# Patient Record
Sex: Male | Born: 1935 | Race: White | Hispanic: No | Marital: Married | Smoking: Never smoker
Health system: Southern US, Community
[De-identification: ages and names within clinical notes are randomized; demographics above are authoritative.]

## PROBLEM LIST (undated history)

## (undated) DIAGNOSIS — R001 Bradycardia, unspecified: Secondary | ICD-10-CM

## (undated) DIAGNOSIS — R002 Palpitations: Secondary | ICD-10-CM

## (undated) DIAGNOSIS — G5600 Carpal tunnel syndrome, unspecified upper limb: Secondary | ICD-10-CM

## (undated) DIAGNOSIS — E785 Hyperlipidemia, unspecified: Secondary | ICD-10-CM

## (undated) HISTORY — DX: Hyperlipidemia, unspecified: E78.5

## (undated) HISTORY — DX: Carpal tunnel syndrome, unspecified upper limb: G56.00

## (undated) HISTORY — DX: Palpitations: R00.2

## (undated) HISTORY — DX: Bradycardia, unspecified: R00.1

## (undated) HISTORY — PX: TRANSTHORACIC ECHOCARDIOGRAM: SHX275

---

## 2008-07-06 ENCOUNTER — Emergency Department (HOSPITAL_BASED_OUTPATIENT_CLINIC_OR_DEPARTMENT_OTHER): Admission: EM | Admit: 2008-07-06 | Discharge: 2008-07-06 | Payer: Self-pay | Admitting: Emergency Medicine

## 2008-07-18 HISTORY — PX: TRANSTHORACIC ECHOCARDIOGRAM: SHX275

## 2010-05-20 LAB — CBC
Platelets: 169 10*3/uL (ref 150–400)
RBC: 4.71 MIL/uL (ref 4.22–5.81)
WBC: 5.3 10*3/uL (ref 4.0–10.5)

## 2010-05-20 LAB — POCT CARDIAC MARKERS
CKMB, poc: 1.6 ng/mL (ref 1.0–8.0)
Myoglobin, poc: 48.3 ng/mL (ref 12–200)
Myoglobin, poc: 64.7 ng/mL (ref 12–200)

## 2010-05-20 LAB — DIFFERENTIAL
Basophils Relative: 0 % (ref 0–1)
Lymphs Abs: 1.1 10*3/uL (ref 0.7–4.0)
Monocytes Relative: 10 % (ref 3–12)
Neutro Abs: 3.6 10*3/uL (ref 1.7–7.7)
Neutrophils Relative %: 67 % (ref 43–77)

## 2010-05-20 LAB — BASIC METABOLIC PANEL
Calcium: 9.4 mg/dL (ref 8.4–10.5)
Creatinine, Ser: 1.2 mg/dL (ref 0.4–1.5)
GFR calc Af Amer: >60 mL/min (ref >60)

## 2011-05-07 ENCOUNTER — Encounter: Payer: Self-pay | Admitting: *Deleted

## 2011-11-16 ENCOUNTER — Encounter: Payer: Self-pay | Admitting: *Deleted

## 2013-04-14 ENCOUNTER — Encounter: Payer: Self-pay | Admitting: Interventional Cardiology

## 2013-05-19 ENCOUNTER — Ambulatory Visit: Payer: Self-pay | Admitting: Interventional Cardiology

## 2013-06-15 ENCOUNTER — Ambulatory Visit (INDEPENDENT_AMBULATORY_CARE_PROVIDER_SITE_OTHER): Payer: Medicare Other | Admitting: Interventional Cardiology

## 2013-06-15 ENCOUNTER — Encounter: Payer: Self-pay | Admitting: Interventional Cardiology

## 2013-06-15 VITALS — BP 120/68 | HR 61 | Ht 66.0 in | Wt 194.0 lb

## 2013-06-15 DIAGNOSIS — I4949 Other premature depolarization: Secondary | ICD-10-CM

## 2013-06-15 DIAGNOSIS — I493 Ventricular premature depolarization: Secondary | ICD-10-CM

## 2013-06-15 DIAGNOSIS — R030 Elevated blood-pressure reading, without diagnosis of hypertension: Secondary | ICD-10-CM

## 2013-06-15 NOTE — Patient Instructions (Signed)
Your physician recommends that you continue on your current medications as directed. Please refer to the Current Medication list given to you today.  Your physician wants you to follow-up in: 1 year with Dr. Varanasi. You will receive a reminder letter in the mail two months in advance. If you don't receive a letter, please call our office to schedule the follow-up appointment.  

## 2013-06-15 NOTE — Progress Notes (Signed)
Patient ID: Brandon Gomez, male   DOB: 1934-01-30, 80 y.o.   MRN: [id number]    [address], [place], [place]  [address] Phone: [phone] Fax:  [phone]  Date:  06/15/2013   ID:  Brandon Gomez, DOB [birth year], MRN [id number]  PCP:  Donnajean Lopes, MD      History of Present Illness: Brandon Gomez is a 80 y.o. male who has had HTN and PACs. He has been checking his BP. Systolic BP is in the 762-263 range. Diastolic has been in the 33L for the most part but goes into the 80s sometimes. HE has not felt PACs, but has noted them when he checks his pulse. He has occasionally taken his Metoprolol. Hyperlipidemia:  Denies : Chest pain.  Shortness of breath.  Dizziness.  Leg edema.  He moves a lot with his job at [employer]. he does not walk regularly.    Wt Readings from Last 3 Encounters:  06/15/13 194 lb (87.998 kg)     Past Medical History  Diagnosis Date  . Hyperlipidemia   . Bradycardia   . Palpitations     Current Outpatient Prescriptions  Medication Sig Dispense Refill  . aspirin 81 MG tablet Take 81 mg by mouth daily.      . Cholecalciferol (VITAMIN D) 2000 UNITS CAPS Take 1 capsule by mouth.      . lovastatin (MEVACOR) 20 MG tablet Take 20 mg by mouth at bedtime.      . Multiple Vitamin (MULTIVITAMIN) tablet Take 1 tablet by mouth daily.      . Omega-3 Fatty Acids (OMEGA 3 PO) Take by mouth. 2 PACKETS DAILY      . vitamin B-12 (CYANOCOBALAMIN) 250 MCG tablet Take 250 mcg by mouth daily.       No current facility-administered medications for this visit.    Allergies:   No Known Allergies  Social History:  The patient  reports that he has never smoked. He does not have any smokeless tobacco history on file.   Family History:  The patient's family history is not on file.   ROS:  Please see the history of present illness.  No nausea, vomiting.  No fevers, chills.  No focal weakness.  No dysuria.   All other systems reviewed and negative.    PHYSICAL EXAM: VS:  BP 120/68  Pulse 61  Ht 5\' 6"  (1.676 m)  Wt 194 lb (87.998 kg)  BMI 31.33 kg/m2 Well nourished, well developed, in no acute distress HEENT: normal Neck: no JVD, no carotid bruits Cardiac:  normal S1, S2; RRR;  Lungs:  clear to auscultation bilaterally, no wheezing, rhonchi or rales Abd: soft, nontender, no hepatomegaly Ext: no edema Skin: warm and dry Neuro:   no focal abnormalities noted  EKG:  Normal     ASSESSMENT AND PLAN:  Observation for suspected cardiovascular disease  Diagnostic Imaging:EKG Corson,Danielle 01/22/2012 09:49:30 AM > VARANASI,JAY 01/22/2012 10:12:28 AM > NSR, PVC  Occasional premature beats. No symptoms. PACs , PVCs in the past.     2. Elevated blood pressure reading without diagnosis of hypertension  Most readings at home are well controlled. WOuld not add medicine at this time.    Preventive Medicine  Adult topics discussed:  Exercise: 5 days a week, at least 30 minutes of aerobic exercise.      Signed, Mina Marble, MD, [facility] 06/15/2013 11:04 AM

## 2013-06-22 ENCOUNTER — Ambulatory Visit: Payer: Self-pay | Admitting: Interventional Cardiology

## 2014-06-27 ENCOUNTER — Encounter: Payer: Self-pay | Admitting: Interventional Cardiology

## 2014-06-27 ENCOUNTER — Ambulatory Visit (INDEPENDENT_AMBULATORY_CARE_PROVIDER_SITE_OTHER): Payer: Medicare Other | Admitting: Interventional Cardiology

## 2014-06-27 ENCOUNTER — Other Ambulatory Visit: Payer: Self-pay | Admitting: Interventional Cardiology

## 2014-06-27 VITALS — BP 130/88 | HR 61 | Ht 67.0 in | Wt 195.8 lb

## 2014-06-27 DIAGNOSIS — R030 Elevated blood-pressure reading, without diagnosis of hypertension: Secondary | ICD-10-CM

## 2014-06-27 DIAGNOSIS — I493 Ventricular premature depolarization: Secondary | ICD-10-CM | POA: Diagnosis not present

## 2014-06-27 NOTE — Patient Instructions (Signed)
Medication Instructions:  Your physician recommends that you continue on your current medications as directed. Please refer to the Current Medication list given to you today.  Labwork: We will get a copy of your recent lab work from [facility].  Testing/Procedures: NONE  Follow-Up: Your physician wants you to follow-up in: 12 months with Dr. Irish Lack. You will receive a reminder letter in the mail two months in advance. If you don't receive a letter, please call our office to schedule the follow-up appointment.   Any Other Special Instructions Will Be Listed Below (If Applicable).

## 2014-06-27 NOTE — Progress Notes (Signed)
Patient ID: Brandon Gomez, male   DOB: 1933-11-27, 81 y.o.   MRN: [id number]     Cardiology Office Note   Date:  06/27/2014   ID:  Brandon Gomez, DOB [birth year], MRN [id number]  PCP:  Donnajean Lopes, MD    No chief complaint on file.    Wt Readings from Last 3 Encounters:  06/27/14 195 lb 12.8 oz (88.814 kg)  06/15/13 194 lb (87.998 kg)       History of Present Illness: Brandon Gomez is a 81 y.o. male  who has had HTN and PACs. He has been checking his BP. Systolic BP is in the 532-992 range. Diastolic has been in the 42A for the most part but goes into the 80s sometimes. He has not felt PACs, but has noted them when he checks his pulse. He has not taken his Metoprolol in the past. Hyperlipidemia:  Denies : Chest pain.  Shortness of breath.  Dizziness.  Leg edema.  He moves a lot with his job at [employer]. he does not walk regularly.  He forgot that he had metoprolol, but he does not need to use it.  He thinks it has expired.       Past Medical History  Diagnosis Date  . Hyperlipidemia   . Bradycardia   . Palpitations     Past Surgical History  Procedure Laterality Date  . Transthoracic echocardiogram  07/18/2008    EF-55-60%/no valvular abnormality/mitral inflow and tissue doppler consistent with impaired LV relaxation     Current Outpatient Prescriptions  Medication Sig Dispense Refill  . aspirin 81 MG tablet Take 81 mg by mouth daily.    . Cholecalciferol (VITAMIN D) 2000 UNITS CAPS Take 1 capsule by mouth.    . lovastatin (MEVACOR) 40 MG tablet Take 20 mg by mouth at bedtime.  0  . Multiple Vitamin (MULTIVITAMIN) tablet Take 1 tablet by mouth daily.    . Omega-3 Fatty Acids (OMEGA 3 PO) Take 2 packets by mouth daily.     . vitamin B-12 (CYANOCOBALAMIN) 250 MCG tablet Take 250 mcg by mouth daily.     No current facility-administered medications for this visit.    Allergies:   Review of patient's allergies indicates no known allergies.    Social  History:  The patient  reports that he has never smoked. He has never used smokeless tobacco.   Family History:  The patient's *family history includes Diabetes in his father and mother; Heart attack in his brother, father, and mother; Heart disease in his sister; Hypertension in his father.    ROS:  Please see the history of present illness.   .   All other systems are reviewed and negative.    PHYSICAL EXAM: VS:  BP 130/88 mmHg  Pulse 61  Ht 5\' 7"  (1.702 m)  Wt 195 lb 12.8 oz (88.814 kg)  BMI 30.66 kg/m2 , BMI Body mass index is 30.66 kg/(m^2). GEN: Well nourished, well developed, in no acute distress HEENT: normal Neck: no JVD, carotid bruits, or masses Cardiac: *RRR; no murmurs, rubs, or gallops,no edema  Respiratory:  clear to auscultation bilaterally, normal work of breathing GI: soft, nontender, nondistended, + BS MS: no deformity or atrophy Skin: warm and dry, no rash Neuro:  Strength and sensation are intact Psych: euthymic mood, full affect   EKG:   The ekg ordered today demonstrates normal   Recent Labs: No results found for requested labs within last 365 days.   Lipid Panel No results found for:  CHOL, TRIG, HDL, CHOLHDL, VLDL, LDLCALC, LDLDIRECT   Other studies Reviewed: Additional studies/ records that were reviewed today with results demonstrating: .   ASSESSMENT AND PLAN:  PACs, PVC:  Occasional premature beats. No symptoms. PACs , PVCs in the past. He will let us know if he wants a new Rx for the metoprolol.    2. Elevated blood pressure reading without diagnosis of hypertension  Most readings at home are well controlled. WOuld not add medicine at this time. COntinue lifestyle modifications including exercise as detailed below.            Current medicines are reviewed at length with the patient today.  The patient concerns regarding his medicines were addressed.  The following changes have been made:  No change  Labs/ tests ordered today  include:  No orders of the defined types were placed in this encounter.    Recommend 150 minutes/week of aerobic exercise Low fat, low carb, high fiber diet recommended  Disposition:   FU in 1 year   Teresita Madura., MD  06/27/2014 10:27 AM    [facility] [place], [place], [place]  [address] Phone: [phone]; Fax: [phone]

## 2015-10-03 ENCOUNTER — Other Ambulatory Visit: Payer: Self-pay | Admitting: Internal Medicine

## 2015-12-12 ENCOUNTER — Encounter: Payer: Self-pay | Admitting: Interventional Cardiology

## 2015-12-26 NOTE — Progress Notes (Signed)
Patient ID: Brandon Gomez, male   DOB: 1933/10/21, 82 y.o.   MRN: [id number]     Cardiology Office Note   Date:  12/27/2015   ID:  Brandon Gomez, DOB [birth year], MRN [id number]  PCP:  Donnajean Lopes, MD    No chief complaint on file.    Wt Readings from Last 3 Encounters:  12/27/15 86.9 kg (191 lb 9.6 oz)  06/27/14 88.8 kg (195 lb 12.8 oz)  06/15/13 88 kg (194 lb)       History of Present Illness: Brandon Gomez is a 82 y.o. male  who has had HTN and PACs. He has been checking his BP. Systolic BP is in the 0000000 range. Diastolic has been in the Q000111Q for the most part but goes into the 80s sometimes. He has not felt PACs, but has noted them when he checks his pulse. He has not taken his Metoprolol in the past. Hyperlipidemia:  Denies : Chest pain.  Shortness of breath.  Dizziness.  Leg edema.  He moves a lot with his job at [employer]. he does not walk regularly.  He forgot that he had metoprolol, but he does not need to use it.  He thinks it has expired.   He does not feel his PACs.  He states the BP cuff occasionally informs him.     Past Medical History:  Diagnosis Date  . Bradycardia   . Hyperlipidemia   . Palpitations     Past Surgical History:  Procedure Laterality Date  . TRANSTHORACIC ECHOCARDIOGRAM  07/18/2008   EF-55-60%/no valvular abnormality/mitral inflow and tissue doppler consistent with impaired LV relaxation     Current Outpatient Prescriptions  Medication Sig Dispense Refill  . aspirin 81 MG tablet Take 81 mg by mouth daily.    . Cholecalciferol (VITAMIN D) 2000 UNITS CAPS Take 1 capsule by mouth.    . lovastatin (MEVACOR) 40 MG tablet Take 20 mg by mouth at bedtime.  0  . Multiple Vitamin (MULTIVITAMIN) tablet Take 1 tablet by mouth daily.    Marland Kitchen neomycin-polymyxin-hydrocortisone (CORTISPORIN) 3.5-10000-1 otic suspension Place 1 drop into both ears daily.   1  . Omega-3 Fatty Acids (OMEGA 3 PO) Take 2 packets by mouth daily.     . vitamin  B-12 (CYANOCOBALAMIN) 250 MCG tablet Take 250 mcg by mouth daily.     No current facility-administered medications for this visit.     Allergies:   Patient has no known allergies.    Social History:  The patient  reports that he has never smoked. He has never used smokeless tobacco. He reports that he does not drink alcohol or use drugs.   Family History:  The patient's *family history includes Diabetes in his father and mother; Heart attack in his brother, father, and mother; Heart disease in his sister; Hypertension in his father.    ROS:  Please see the history of present illness.  Rare PACs noted by BP cuff.   All other systems are reviewed and negative.    PHYSICAL EXAM: VS:  BP 130/80   Pulse (!) 59   Ht 5\' 7"  (1.702 m)   Wt 86.9 kg (191 lb 9.6 oz)   BMI 30.01 kg/m  , BMI Body mass index is 30.01 kg/m. GEN: Well nourished, well developed, in no acute distress  HEENT: normal  Neck: no JVD, carotid bruits, or masses Cardiac: *RRR; no murmurs, rubs, or gallops,no edema  Respiratory:  clear to auscultation bilaterally, normal work of breathing GI: soft, nontender,  nondistended, + BS MS: no deformity or atrophy  Skin: warm and dry, no rash Neuro:  Strength and sensation are intact Psych: euthymic mood, full affect   EKG:   The ekg ordered today demonstrates normal   Recent Labs: No results found for requested labs within last 8760 hours.   Lipid Panel No results found for: CHOL, TRIG, HDL, CHOLHDL, VLDL, LDLCALC, LDLDIRECT   Other studies Reviewed: Additional studies/ records that were reviewed today with results demonstrating: .   ASSESSMENT AND PLAN:  PACs, PVC:  Occasional premature beats. No symptoms. PACs , PVCs in the past. Picked up by the blood pressure monitor.Will call in a new Rx for the metoprolol 12.5 mg by mouth twice a day when necessary.    2. Elevated blood pressure reading without diagnosis of hypertension  In the past. Blood pressure  controlled today.  Most readings at home are well controlled. WOuld not add medicine at this time. COntinue lifestyle modifications including exercise as detailed below.            Current medicines are reviewed at length with the patient today.  The patient concerns regarding his medicines were addressed.  The following changes have been made:  No change  Labs/ tests ordered today include:  No orders of the defined types were placed in this encounter.   Recommend 150 minutes/week of aerobic exercise Low fat, low carb, high fiber diet recommended  Disposition:   FU in 1 year   Signed, Larae Grooms, MD  12/27/2015 10:02 AM    [facility] [place], [place], [place]  [address] Phone: [phone]; Fax: [phone]

## 2015-12-27 ENCOUNTER — Encounter (INDEPENDENT_AMBULATORY_CARE_PROVIDER_SITE_OTHER): Payer: Self-pay

## 2015-12-27 ENCOUNTER — Ambulatory Visit (INDEPENDENT_AMBULATORY_CARE_PROVIDER_SITE_OTHER): Payer: Medicare Other | Admitting: Interventional Cardiology

## 2015-12-27 ENCOUNTER — Encounter: Payer: Self-pay | Admitting: Interventional Cardiology

## 2015-12-27 VITALS — BP 130/80 | HR 59 | Ht 67.0 in | Wt 191.6 lb

## 2015-12-27 DIAGNOSIS — I493 Ventricular premature depolarization: Secondary | ICD-10-CM | POA: Diagnosis not present

## 2015-12-27 DIAGNOSIS — I491 Atrial premature depolarization: Secondary | ICD-10-CM | POA: Diagnosis not present

## 2015-12-27 MED ORDER — METOPROLOL TARTRATE 25 MG PO TABS: 12.5000 mg | ORAL_TABLET | Freq: Two times a day (BID) | ORAL | 3 refills | Status: DC | PRN

## 2015-12-27 NOTE — Patient Instructions (Signed)
Medication Instructions:  Start taking Metoprolol 12.5 mg twice daily as needed for palpitations. All other medications remain the same.  Labwork: None  Testing/Procedures: None  Follow-Up: Your physician wants you to follow-up in: 1 year. You will receive a reminder letter in the mail two months in advance. If you don't receive a letter, please call our office to schedule the follow-up appointment.     If you need a refill on your cardiac medications before your next appointment, please call your pharmacy.

## 2015-12-30 ENCOUNTER — Other Ambulatory Visit: Payer: Self-pay | Admitting: Internal Medicine

## 2015-12-30 DIAGNOSIS — R911 Solitary pulmonary nodule: Secondary | ICD-10-CM

## 2016-01-08 ENCOUNTER — Ambulatory Visit
Admission: RE | Admit: 2016-01-08 | Discharge: 2016-01-08 | Disposition: A | Discharge status: Home / Self Care | Payer: Medicare Other | Source: Ambulatory Visit | Attending: Internal Medicine | Admitting: Internal Medicine

## 2016-01-08 DIAGNOSIS — R911 Solitary pulmonary nodule: Secondary | ICD-10-CM

## 2017-01-08 ENCOUNTER — Other Ambulatory Visit: Payer: Self-pay | Admitting: Internal Medicine

## 2017-01-08 DIAGNOSIS — R911 Solitary pulmonary nodule: Secondary | ICD-10-CM

## 2017-01-14 ENCOUNTER — Ambulatory Visit
Admission: RE | Admit: 2017-01-14 | Discharge: 2017-01-14 | Disposition: A | Discharge status: Home / Self Care | Payer: Medicare Other | Source: Ambulatory Visit | Attending: Internal Medicine | Admitting: Internal Medicine

## 2017-01-14 DIAGNOSIS — R911 Solitary pulmonary nodule: Secondary | ICD-10-CM

## 2017-03-11 DIAGNOSIS — R69 Illness, unspecified: Secondary | ICD-10-CM | POA: Diagnosis not present

## 2017-03-17 ENCOUNTER — Encounter: Payer: Self-pay | Admitting: Interventional Cardiology

## 2017-03-22 NOTE — Progress Notes (Signed)
Cardiology Office Note   Date:  03/24/2017   ID:  Brandon Gomez, DOB May 26, 1933, MRN [id number]  PCP:  Leanna Battles, MD    No chief complaint on file.  PACs  Wt Readings from Last 3 Encounters:  03/24/17 193 lb 1.9 oz (87.6 kg)  12/27/15 191 lb 9.6 oz (86.9 kg)  06/27/14 195 lb 12.8 oz (88.8 kg)       History of Present Illness: Brandon Gomez is a 84 y.o. male   who has had HTN and PACs. He has been checking his BP. Systolic BP is in the 010-932 range. Diastolic has been in the 35T for the most part but goes into the 80s sometimes. He has not felt PACs, but has noted them when he checks his pulse. He has not taken his Metoprolol in the past.  Works at [employer].    He does not feel his PACs.  He states the BP cuff occasionally informs him.   Denies : Chest pain. Dizziness. Leg edema. Nitroglycerin use. Orthopnea. Palpitations. Paroxysmal nocturnal dyspnea. Shortness of breath. Syncope.   He is taking prophylactic dose of Tamiflu due to his wife's illness.       Past Medical History:  Diagnosis Date  . Bradycardia   . Hyperlipidemia   . Palpitations     Past Surgical History:  Procedure Laterality Date  . TRANSTHORACIC ECHOCARDIOGRAM  07/18/2008   EF-55-60%/no valvular abnormality/mitral inflow and tissue doppler consistent with impaired LV relaxation     Current Outpatient Medications  Medication Sig Dispense Refill  . aspirin 81 MG tablet Take 81 mg by mouth daily.    . Cholecalciferol (VITAMIN D) 2000 UNITS CAPS Take 1 capsule by mouth.    . lovastatin (MEVACOR) 40 MG tablet Take 20 mg by mouth at bedtime.  0  . Multiple Vitamin (MULTIVITAMIN) tablet Take 1 tablet by mouth daily.    Marland Kitchen neomycin-polymyxin-hydrocortisone (CORTISPORIN) 3.5-10000-1 otic suspension Place 1 drop into both ears daily.   1  . Omega-3 Fatty Acids (OMEGA 3 PO) Take 2 packets by mouth daily.     Marland Kitchen oseltamivir (TAMIFLU) 75 MG capsule Take 75 mg by mouth daily.    . vitamin  B-12 (CYANOCOBALAMIN) 250 MCG tablet Take 250 mcg by mouth daily.    . metoprolol tartrate (LOPRESSOR) 25 MG tablet Take 0.5 tablets (12.5 mg total) by mouth 2 (two) times daily as needed. 60 tablet 3   No current facility-administered medications for this visit.     Allergies:   Patient has no known allergies.    Social History:  The patient  reports that  has never smoked. he has never used smokeless tobacco. He reports that he does not drink alcohol or use drugs.   Family History:  The patient's family history includes Diabetes in his father and mother; Heart attack in his brother, father, and mother; Heart disease in his sister; Hypertension in his father.    ROS:  Please see the history of present illness.   Otherwise, review of systems are positive for minimal palpitations.   All other systems are reviewed and negative.    PHYSICAL EXAM: VS:  BP 122/88   Pulse 62   Ht 5\' 8"  (1.727 m)   Wt 193 lb 1.9 oz (87.6 kg)   SpO2 93%   BMI 29.36 kg/m  , BMI Body mass index is 29.36 kg/m. GEN: Well nourished, well developed, in no acute distress  HEENT: normal  Neck: no JVD, carotid bruits, or  masses Cardiac: RRR; no murmurs, rubs, or gallops,no edema  Respiratory:  clear to auscultation bilaterally, normal work of breathing GI: soft, nontender, nondistended, + BS MS: no deformity or atrophy  Skin: warm and dry, no rash Neuro:  Strength and sensation are intact Psych: euthymic mood, full affect   EKG:   The ekg ordered today demonstrates NSR, no ST changes   Recent Labs: No results found for requested labs within last 8760 hours.   Lipid Panel No results found for: CHOL, TRIG, HDL, CHOLHDL, VLDL, LDLCALC, LDLDIRECT   Other studies Reviewed: Additional studies/ records that were reviewed today with results demonstrating: LDL 86 in May 2018.   ASSESSMENT AND PLAN:  1. PACs, PVCs : Essentially asymptomatic.  Continue current medicines. 2. Elevated BP : Blood pressure  well controlled today. 3. Continue healthy diet with regular exercise.   Current medicines are reviewed at length with the patient today.  The patient concerns regarding his medicines were addressed.  The following changes have been made:  No change  Labs/ tests ordered today include:  No orders of the defined types were placed in this encounter.   Recommend 150 minutes/week of aerobic exercise Low fat, low carb, high fiber diet recommended  Disposition:   FU in 1 year   Signed, Larae Grooms, MD  03/24/2017 10:33 AM    [facility] [place], [place], [place]  [address] Phone: [phone]; Fax: [phone]

## 2017-03-24 ENCOUNTER — Ambulatory Visit: Payer: Medicare Other | Admitting: Interventional Cardiology

## 2017-03-24 ENCOUNTER — Encounter: Payer: Self-pay | Admitting: Interventional Cardiology

## 2017-03-24 VITALS — BP 122/88 | HR 62 | Ht 68.0 in | Wt 193.1 lb

## 2017-03-24 DIAGNOSIS — I493 Ventricular premature depolarization: Secondary | ICD-10-CM

## 2017-03-24 DIAGNOSIS — I491 Atrial premature depolarization: Secondary | ICD-10-CM

## 2017-03-24 DIAGNOSIS — R030 Elevated blood-pressure reading, without diagnosis of hypertension: Secondary | ICD-10-CM | POA: Diagnosis not present

## 2017-03-24 NOTE — Patient Instructions (Signed)

## 2017-06-02 DIAGNOSIS — R69 Illness, unspecified: Secondary | ICD-10-CM | POA: Diagnosis not present

## 2017-07-08 DIAGNOSIS — E7849 Other hyperlipidemia: Secondary | ICD-10-CM | POA: Diagnosis not present

## 2017-07-08 DIAGNOSIS — Z125 Encounter for screening for malignant neoplasm of prostate: Secondary | ICD-10-CM | POA: Diagnosis not present

## 2017-07-09 DIAGNOSIS — Z820 Family history of epilepsy and other diseases of the nervous system: Secondary | ICD-10-CM | POA: Diagnosis not present

## 2017-07-09 DIAGNOSIS — Z823 Family history of stroke: Secondary | ICD-10-CM | POA: Diagnosis not present

## 2017-07-09 DIAGNOSIS — E669 Obesity, unspecified: Secondary | ICD-10-CM | POA: Diagnosis not present

## 2017-07-09 DIAGNOSIS — Z6830 Body mass index (BMI) 30.0-30.9, adult: Secondary | ICD-10-CM | POA: Diagnosis not present

## 2017-07-09 DIAGNOSIS — K219 Gastro-esophageal reflux disease without esophagitis: Secondary | ICD-10-CM | POA: Diagnosis not present

## 2017-07-09 DIAGNOSIS — Z8249 Family history of ischemic heart disease and other diseases of the circulatory system: Secondary | ICD-10-CM | POA: Diagnosis not present

## 2017-07-09 DIAGNOSIS — Z833 Family history of diabetes mellitus: Secondary | ICD-10-CM | POA: Diagnosis not present

## 2017-07-09 DIAGNOSIS — E785 Hyperlipidemia, unspecified: Secondary | ICD-10-CM | POA: Diagnosis not present

## 2017-07-15 DIAGNOSIS — E7849 Other hyperlipidemia: Secondary | ICD-10-CM | POA: Diagnosis not present

## 2017-07-15 DIAGNOSIS — Z6830 Body mass index (BMI) 30.0-30.9, adult: Secondary | ICD-10-CM | POA: Diagnosis not present

## 2017-07-15 DIAGNOSIS — R911 Solitary pulmonary nodule: Secondary | ICD-10-CM | POA: Diagnosis not present

## 2017-07-15 DIAGNOSIS — N183 Chronic kidney disease, stage 3 (moderate): Secondary | ICD-10-CM | POA: Diagnosis not present

## 2017-07-15 DIAGNOSIS — M19041 Primary osteoarthritis, right hand: Secondary | ICD-10-CM | POA: Diagnosis not present

## 2017-07-15 DIAGNOSIS — Z0000 Encounter for general adult medical examination without abnormal findings: Secondary | ICD-10-CM | POA: Diagnosis not present

## 2017-07-15 DIAGNOSIS — D229 Melanocytic nevi, unspecified: Secondary | ICD-10-CM | POA: Diagnosis not present

## 2017-07-15 DIAGNOSIS — Z1389 Encounter for screening for other disorder: Secondary | ICD-10-CM | POA: Diagnosis not present

## 2017-07-20 ENCOUNTER — Other Ambulatory Visit: Payer: Self-pay | Admitting: Internal Medicine

## 2017-07-20 DIAGNOSIS — R911 Solitary pulmonary nodule: Secondary | ICD-10-CM

## 2017-08-02 ENCOUNTER — Other Ambulatory Visit: Payer: Medicare HMO

## 2017-08-20 DIAGNOSIS — L814 Other melanin hyperpigmentation: Secondary | ICD-10-CM | POA: Diagnosis not present

## 2017-08-20 DIAGNOSIS — L812 Freckles: Secondary | ICD-10-CM | POA: Diagnosis not present

## 2017-08-20 DIAGNOSIS — D485 Neoplasm of uncertain behavior of skin: Secondary | ICD-10-CM | POA: Diagnosis not present

## 2017-08-20 DIAGNOSIS — L821 Other seborrheic keratosis: Secondary | ICD-10-CM | POA: Diagnosis not present

## 2017-08-20 DIAGNOSIS — D224 Melanocytic nevi of scalp and neck: Secondary | ICD-10-CM | POA: Diagnosis not present

## 2017-09-16 ENCOUNTER — Ambulatory Visit (INDEPENDENT_AMBULATORY_CARE_PROVIDER_SITE_OTHER): Payer: Medicare HMO | Admitting: Physician Assistant

## 2017-09-16 ENCOUNTER — Ambulatory Visit (INDEPENDENT_AMBULATORY_CARE_PROVIDER_SITE_OTHER): Payer: Medicare HMO

## 2017-09-16 ENCOUNTER — Encounter (INDEPENDENT_AMBULATORY_CARE_PROVIDER_SITE_OTHER): Payer: Self-pay | Admitting: Physician Assistant

## 2017-09-16 DIAGNOSIS — G8929 Other chronic pain: Secondary | ICD-10-CM

## 2017-09-16 DIAGNOSIS — M545 Low back pain, unspecified: Secondary | ICD-10-CM | POA: Insufficient documentation

## 2017-09-16 MED ORDER — METHOCARBAMOL 500 MG PO TABS
ORAL_TABLET | ORAL | 0 refills | Status: DC
Start: 2017-09-16 — End: 2022-05-18

## 2017-09-16 MED ORDER — METHOCARBAMOL 500 MG PO TABS: ORAL_TABLET | ORAL | 0 refills | Status: DC

## 2017-09-16 MED ORDER — METHYLPREDNISOLONE 4 MG PO TBPK: ORAL_TABLET | ORAL | 0 refills | Status: DC

## 2017-09-16 NOTE — Progress Notes (Signed)
Office Visit Note   Patient: Brandon Gomez Age           Date of Birth: [birth year]           MRN: [id number] Visit Date: 09/16/2017              Requested by: Leanna Battles, MD [address] [place], [place] [address] PCP: Leanna Battles, MD   Assessment & Plan: Visit Diagnoses:  1. Chronic low back pain without sciatica, unspecified back pain laterality     Plan: Impression is left SI joint pain.  We will place the patient on a steroid taper and muscle relaxer.  We will also start him in formal physical therapy.  He will follow-up with [place] in 6 weeks time for recheck.  Follow-Up Instructions: Return in about 6 weeks (around 10/28/2017).   Orders:  Orders Placed This Encounter  Procedures  . XR Lumbar Spine 2-3 Views   Meds ordered this encounter  Medications  . methylPREDNISolone (MEDROL DOSEPAK) 4 MG TBPK tablet    Sig: Take as directed    Dispense:  21 tablet    Refill:  0  . DISCONTD: methocarbamol (ROBAXIN) 500 MG tablet    Sig: Take 1/2-1 tab once daily at night for muscle spasms    Dispense:  10 tablet    Refill:  0  . methocarbamol (ROBAXIN) 500 MG tablet    Sig: Take 1/2-1 tab once daily at night for muscle spasms    Dispense:  10 tablet    Refill:  0      Procedures: No procedures performed   Clinical Data: No additional findings.   Subjective: Chief Complaint  Patient presents with  . Lower Back - Pain    HPI Sigmund is a pleasant 84 year old gentleman who presents to our clinic today with left-sided lower back pain.  This began a few years back pain has progressively worsened.  No known injury or change in activity.  He denies any pain down the back of his leg or to the groin/anterior thigh.  His pain started out intermittent but has become more constant.  The pain he has is described as a sharp pain when he first goes from a seated to standing position.  He also has some pain trying to turn over on the left side.  He was initially taking  ibuprofen which seemed to help, but is no longer relieving his symptoms.  No numbness, tingling or burning.  No bowel or bladder change and no saddle paresthesias.  No lumbar pathology.  Review of Systems as detailed in HPI.  All others reviewed and are negative.   Objective: Vital Signs: There were no vitals taken for this visit.  Physical Exam well-developed and well-nourished gentleman in no acute distress.  Alert and oriented x3.  Ortho Exam examination of his lumbar spine reveals moderate tenderness to the left SI joint.  Negative logroll and negative straight leg raise.  Full range of motion of the lumbar spine although this area feels tight with flexion and extension.  No focal weakness.  He is neurovascularly intact distally.  Specialty Comments:  No specialty comments available.  Imaging: Xr Lumbar Spine 2-3 Views  Result Date: 09/16/2017 X-rays show marked degenerative disc disease at L3-4 with an anterolisthesis at L5    PMFS History: Patient Active Problem List   Diagnosis Date Noted  . Chronic low back pain without sciatica 09/16/2017  . PVC (premature ventricular contraction) 06/15/2013  . Elevated blood pressure reading without  diagnosis of hypertension 06/15/2013   Past Medical History:  Diagnosis Date  . Bradycardia   . Hyperlipidemia   . Palpitations     Family History  Problem Relation Age of Onset  . Diabetes Father   . Heart attack Father   . Hypertension Father   . Heart attack Mother   . Diabetes Mother   . Heart attack Brother   . Heart disease Sister     Past Surgical History:  Procedure Laterality Date  . TRANSTHORACIC ECHOCARDIOGRAM  07/18/2008   EF-55-60%/no valvular abnormality/mitral inflow and tissue doppler consistent with impaired LV relaxation   Social History   Occupational History  . 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 Smoking status: Never Smoker  . Smokeless tobacco:  Never Used  Substance and Sexual Activity  . Alcohol use: No    Alcohol/week: 0.0 standard drinks  . Drug use: No  . Sexual activity: Not on file

## 2017-10-07 DIAGNOSIS — Z23 Encounter for immunization: Secondary | ICD-10-CM | POA: Diagnosis not present

## 2017-10-13 DIAGNOSIS — M5388 Other specified dorsopathies, sacral and sacrococcygeal region: Secondary | ICD-10-CM | POA: Diagnosis not present

## 2017-10-13 DIAGNOSIS — M2560 Stiffness of unspecified joint, not elsewhere classified: Secondary | ICD-10-CM | POA: Diagnosis not present

## 2017-10-13 DIAGNOSIS — R293 Abnormal posture: Secondary | ICD-10-CM | POA: Diagnosis not present

## 2017-10-13 DIAGNOSIS — M545 Low back pain: Secondary | ICD-10-CM | POA: Diagnosis not present

## 2017-10-15 DIAGNOSIS — M545 Low back pain: Secondary | ICD-10-CM | POA: Diagnosis not present

## 2017-10-15 DIAGNOSIS — M2560 Stiffness of unspecified joint, not elsewhere classified: Secondary | ICD-10-CM | POA: Diagnosis not present

## 2017-10-15 DIAGNOSIS — R293 Abnormal posture: Secondary | ICD-10-CM | POA: Diagnosis not present

## 2017-10-15 DIAGNOSIS — M5388 Other specified dorsopathies, sacral and sacrococcygeal region: Secondary | ICD-10-CM | POA: Diagnosis not present

## 2017-10-18 DIAGNOSIS — M5388 Other specified dorsopathies, sacral and sacrococcygeal region: Secondary | ICD-10-CM | POA: Diagnosis not present

## 2017-10-18 DIAGNOSIS — M2560 Stiffness of unspecified joint, not elsewhere classified: Secondary | ICD-10-CM | POA: Diagnosis not present

## 2017-10-18 DIAGNOSIS — M545 Low back pain: Secondary | ICD-10-CM | POA: Diagnosis not present

## 2017-10-18 DIAGNOSIS — R293 Abnormal posture: Secondary | ICD-10-CM | POA: Diagnosis not present

## 2017-10-19 DIAGNOSIS — M545 Low back pain: Secondary | ICD-10-CM | POA: Diagnosis not present

## 2017-10-19 DIAGNOSIS — M2560 Stiffness of unspecified joint, not elsewhere classified: Secondary | ICD-10-CM | POA: Diagnosis not present

## 2017-10-19 DIAGNOSIS — R293 Abnormal posture: Secondary | ICD-10-CM | POA: Diagnosis not present

## 2017-10-19 DIAGNOSIS — M5388 Other specified dorsopathies, sacral and sacrococcygeal region: Secondary | ICD-10-CM | POA: Diagnosis not present

## 2017-10-25 DIAGNOSIS — M2560 Stiffness of unspecified joint, not elsewhere classified: Secondary | ICD-10-CM | POA: Diagnosis not present

## 2017-10-25 DIAGNOSIS — M545 Low back pain: Secondary | ICD-10-CM | POA: Diagnosis not present

## 2017-10-25 DIAGNOSIS — M5388 Other specified dorsopathies, sacral and sacrococcygeal region: Secondary | ICD-10-CM | POA: Diagnosis not present

## 2017-10-25 DIAGNOSIS — R293 Abnormal posture: Secondary | ICD-10-CM | POA: Diagnosis not present

## 2017-10-26 DIAGNOSIS — R293 Abnormal posture: Secondary | ICD-10-CM | POA: Diagnosis not present

## 2017-10-26 DIAGNOSIS — M2560 Stiffness of unspecified joint, not elsewhere classified: Secondary | ICD-10-CM | POA: Diagnosis not present

## 2017-10-26 DIAGNOSIS — M545 Low back pain: Secondary | ICD-10-CM | POA: Diagnosis not present

## 2017-10-26 DIAGNOSIS — M5388 Other specified dorsopathies, sacral and sacrococcygeal region: Secondary | ICD-10-CM | POA: Diagnosis not present

## 2017-10-28 ENCOUNTER — Ambulatory Visit (INDEPENDENT_AMBULATORY_CARE_PROVIDER_SITE_OTHER): Payer: Medicare HMO | Admitting: Orthopaedic Surgery

## 2017-10-29 ENCOUNTER — Encounter (INDEPENDENT_AMBULATORY_CARE_PROVIDER_SITE_OTHER): Payer: Self-pay | Admitting: Orthopaedic Surgery

## 2017-10-29 ENCOUNTER — Ambulatory Visit (INDEPENDENT_AMBULATORY_CARE_PROVIDER_SITE_OTHER): Payer: Medicare HMO | Admitting: Orthopaedic Surgery

## 2017-10-29 DIAGNOSIS — G8929 Other chronic pain: Secondary | ICD-10-CM | POA: Diagnosis not present

## 2017-10-29 DIAGNOSIS — M545 Low back pain: Secondary | ICD-10-CM

## 2017-10-29 MED ORDER — DICLOFENAC SODIUM 1 % TD GEL: 2.0000 g | Freq: Four times a day (QID) | TRANSDERMAL | 6 refills | Status: DC

## 2017-10-29 NOTE — Progress Notes (Signed)
   Post-Op Visit Note   Patient: Brandon Gomez           Date of Birth: 05-Feb-1934           MRN: [id number] Visit Date: 10/29/2017 PCP: Leanna Battles, MD   Assessment & Plan:  Chief Complaint:  Chief Complaint  Patient presents with  . Lower Back - Pain   Visit Diagnoses:  1. Chronic low back pain without sciatica, unspecified back pain laterality     Plan: Patient is a pleasant 84 year old gentleman who presents to our clinic today for recheck of his left SI joint pain.  He was seen by [place] about 6 weeks ago for this.  We started him on a steroid taper and muscle relaxer as well as send him to formal physical therapy.  The combination has significantly helped.  He has very minimal pain at most.  Able to do everything he wants.  Overall much improved.  At this point, he would like to finish his 2 remaining physical therapy sessions.  He will continue to work on a stretching program at home once he has finished therapy.  He will follow-up with [place] as needed.  Follow-Up Instructions: Return if symptoms worsen or fail to improve.   Orders:  No orders of the defined types were placed in this encounter.  Meds ordered this encounter  Medications  . diclofenac sodium (VOLTAREN) 1 % GEL    Sig: Apply 2 g topically 4 (four) times daily.    Dispense:  1 Tube    Refill:  6    Imaging: No new imaging  PMFS History: Patient Active Problem List   Diagnosis Date Noted  . Chronic low back pain without sciatica 09/16/2017  . PVC (premature ventricular contraction) 06/15/2013  . Elevated blood pressure reading without diagnosis of hypertension 06/15/2013   Past Medical History:  Diagnosis Date  . Bradycardia   . Hyperlipidemia   . Palpitations     Family History  Problem Relation Age of Onset  . Diabetes Father   . Heart attack Father   . Hypertension Father   . Heart attack Mother   . Diabetes Mother   . Heart attack Brother   . Heart disease Sister     Past Surgical  History:  Procedure Laterality Date  . TRANSTHORACIC ECHOCARDIOGRAM  07/18/2008   EF-55-60%/no valvular abnormality/mitral inflow and tissue doppler consistent with impaired LV relaxation   Social History   Occupational History  . 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 Smoking status: Never Smoker  . Smokeless tobacco: Never Used  Substance and Sexual Activity  . Alcohol use: No    Alcohol/week: 0.0 standard drinks  . Drug use: No  . Sexual activity: Not on file

## 2017-11-01 DIAGNOSIS — M545 Low back pain: Secondary | ICD-10-CM | POA: Diagnosis not present

## 2017-11-01 DIAGNOSIS — M5388 Other specified dorsopathies, sacral and sacrococcygeal region: Secondary | ICD-10-CM | POA: Diagnosis not present

## 2017-11-01 DIAGNOSIS — M2560 Stiffness of unspecified joint, not elsewhere classified: Secondary | ICD-10-CM | POA: Diagnosis not present

## 2017-11-01 DIAGNOSIS — R293 Abnormal posture: Secondary | ICD-10-CM | POA: Diagnosis not present

## 2017-11-09 DIAGNOSIS — R293 Abnormal posture: Secondary | ICD-10-CM | POA: Diagnosis not present

## 2017-11-09 DIAGNOSIS — M2560 Stiffness of unspecified joint, not elsewhere classified: Secondary | ICD-10-CM | POA: Diagnosis not present

## 2017-11-09 DIAGNOSIS — M545 Low back pain: Secondary | ICD-10-CM | POA: Diagnosis not present

## 2017-11-09 DIAGNOSIS — M5388 Other specified dorsopathies, sacral and sacrococcygeal region: Secondary | ICD-10-CM | POA: Diagnosis not present

## 2017-11-18 DIAGNOSIS — Z961 Presence of intraocular lens: Secondary | ICD-10-CM | POA: Diagnosis not present

## 2017-11-18 DIAGNOSIS — H35373 Puckering of macula, bilateral: Secondary | ICD-10-CM | POA: Diagnosis not present

## 2017-11-18 DIAGNOSIS — H40013 Open angle with borderline findings, low risk, bilateral: Secondary | ICD-10-CM | POA: Diagnosis not present

## 2017-11-18 DIAGNOSIS — H26492 Other secondary cataract, left eye: Secondary | ICD-10-CM | POA: Diagnosis not present

## 2018-01-14 ENCOUNTER — Ambulatory Visit
Admission: RE | Admit: 2018-01-14 | Discharge: 2018-01-14 | Disposition: A | Discharge status: Home / Self Care | Payer: Medicare HMO | Source: Ambulatory Visit | Attending: Internal Medicine | Admitting: Internal Medicine

## 2018-01-14 DIAGNOSIS — R911 Solitary pulmonary nodule: Secondary | ICD-10-CM | POA: Diagnosis not present

## 2018-01-14 DIAGNOSIS — J449 Chronic obstructive pulmonary disease, unspecified: Secondary | ICD-10-CM | POA: Diagnosis not present

## 2018-04-20 DIAGNOSIS — H903 Sensorineural hearing loss, bilateral: Secondary | ICD-10-CM | POA: Diagnosis not present

## 2018-04-28 ENCOUNTER — Telehealth: Payer: Self-pay

## 2018-04-28 NOTE — Telephone Encounter (Addendum)
   Cardiac Questionnaire:    Since your last visit or hospitalization:    1. Have you been having new or worsening chest pain? NO   2. Have you been having new or worsening shortness of breath? NO 3. Have you been having new or worsening leg swelling, wt gain, or increase in abdominal girth (pants fitting more tightly)? NO   4. Have you had any passing out spells? NO    *A YES to any of these questions would result in the appointment being kept. *If all the answers to these questions are NO, we should indicate that given the current situation regarding the worldwide coronarvirus pandemic, at the recommendation of the CDC, we are looking to limit gatherings in our waiting area, and thus will reschedule their appointment beyond four weeks from today.   _____________   [id number] Pre-Screening Questions:  . Do you currently have a fever? NO (yes = cancel and refer to pcp for e-visit) . Have you recently travelled on a cruise, internationally, or to [place], [place], [place], [place], [place], or [place], [place] [employer]) ? NO (yes = cancel, stay home, monitor symptoms, and contact pcp or initiate e-visit if symptoms develop) . Have you been in contact with someone that is currently pending confirmation of Covid19 testing or has been confirmed to have the [place] virus?  NO (yes = cancel, stay home, away from tested individual, monitor symptoms, and contact pcp or initiate e-visit if symptoms develop) . Are you currently experiencing fatigue or cough? NO (yes = pt should be prepared to have a mask placed at the time of their visit).  Appointment Cancelled due to Coronavirus:  Called patient in regards to  f/u appointment with Dr. Irish Lack on 05/05/2018  Patient denies having any chest pain, SOB, cough, fever, or any other Sx.   Patient was okay with cancelling appointment and has been made aware that they will be contacted in the near future to reschedule.   Refills have been sent in if needed.   Patient  understands to let us know if they develop any Sx before then.

## 2018-05-05 ENCOUNTER — Ambulatory Visit: Payer: Medicare HMO | Admitting: Interventional Cardiology

## 2018-05-16 NOTE — Telephone Encounter (Signed)
Virtual Visit Pre-Appointment Phone Call  TELEPHONE CALL NOTE  Brandon Gomez has been deemed a candidate for a follow-up tele-health visit to limit community exposure during the Covid-19 pandemic. I spoke with the patient via phone to ensure availability of phone/video source, confirm preferred email & phone number, and discuss instructions and expectations.  I reminded Brandon Gomez to be prepared with any vital sign and/or heart rhythm information that could potentially be obtained via home monitoring, at the time of his visit. I reminded Brandon Gomez to expect a phone call at the time of his visit if his visit.  Did the patient verbally acknowledge consent to treatment? YES  Patient only has flip phone and cannot do VIDEO. Patient agrees to [facility] Visit with Dr. Irish Lack on 4/7  Brandon Gomez, [place] 05/16/2018 12:36 PM   DOWNLOADING [place], go to [employer] and type in WebEx in the search bar. [place] [employer], the blue/green circle. The app is free but as with any other app downloads, their phone may require them to verify saved payment information or Apple password. The patient does NOT have to create an account.  - If Android, ask patient to go to [employer] and type in WebEx in the search bar. [place] [employer], the blue/green circle. The app is free but as with any other app downloads, their phone may require them to verify saved payment information or Android password. The patient does NOT have to create an account.   CONSENT FOR TELE-HEALTH VISIT - PLEASE REVIEW  I hereby voluntarily request, consent and authorize [facility] and its employed or contracted physicians, physician assistants, nurse practitioners or other licensed health care professionals (the Practitioner), to provide me with telemedicine health care services (the "Services") as deemed necessary by the treating Practitioner. I  acknowledge and consent to receive the Services by the Practitioner via telemedicine. I understand that the telemedicine visit will involve communicating with the Practitioner through live audiovisual communication technology and the disclosure of certain medical information by electronic transmission. I acknowledge that I have been given the opportunity to request an in-person assessment or other available alternative prior to the telemedicine visit and am voluntarily participating in the telemedicine visit.  I understand that I have the right to withhold or withdraw my consent to the use of telemedicine in the course of my care at any time, without affecting my right to future care or treatment, and that the Practitioner or I may terminate the telemedicine visit at any time. I understand that I have the right to inspect all information obtained and/or recorded in the course of the telemedicine visit and may receive copies of available information for a reasonable fee.  I understand that some of the potential risks of receiving the Services via telemedicine include:  Marland Kitchen Delay or interruption in medical evaluation due to technological equipment failure or disruption; . Information transmitted may not be sufficient (e.g. poor resolution of images) to allow for appropriate medical decision making by the Practitioner; and/or  . In rare instances, security protocols could fail, causing a breach of personal health information.  Furthermore, I acknowledge that it is my responsibility to provide information about my medical history, conditions and care that is complete and accurate to the best of my ability. I acknowledge that Practitioner's advice, recommendations, and/or decision may be based on factors not within their control, such as incomplete or inaccurate data provided by  me or distortions of diagnostic images or specimens that may result from electronic transmissions. I understand that the practice of  medicine is not an exact science and that Practitioner makes no warranties or guarantees regarding treatment outcomes. I acknowledge that I will receive a copy of this consent concurrently upon execution via email to the email address I last provided but may also request a printed copy by calling the office of [place].    I understand that my insurance will be billed for this visit.   I have read or had this consent read to me. . I understand the contents of this consent, which adequately explains the benefits and risks of the Services being provided via telemedicine.  . I have been provided ample opportunity to ask questions regarding this consent and the Services and have had my questions answered to my satisfaction. . I give my informed consent for the services to be provided through the use of telemedicine in my medical care  By participating in this telemedicine visit I agree to the above.

## 2018-05-17 ENCOUNTER — Telehealth (INDEPENDENT_AMBULATORY_CARE_PROVIDER_SITE_OTHER): Payer: Medicare HMO | Admitting: Interventional Cardiology

## 2018-05-17 ENCOUNTER — Encounter: Payer: Self-pay | Admitting: Interventional Cardiology

## 2018-05-17 ENCOUNTER — Other Ambulatory Visit: Payer: Self-pay

## 2018-05-17 DIAGNOSIS — I493 Ventricular premature depolarization: Secondary | ICD-10-CM

## 2018-05-17 DIAGNOSIS — R030 Elevated blood-pressure reading, without diagnosis of hypertension: Secondary | ICD-10-CM | POA: Diagnosis not present

## 2018-05-17 NOTE — Patient Instructions (Signed)

## 2018-05-17 NOTE — Progress Notes (Signed)
Virtual Visit via Telephone Note   This visit type was conducted due to national recommendations for restrictions regarding the COVID-19 Pandemic (e.g. social distancing) in an effort to limit this patient's exposure and mitigate transmission in our community.  Due to his co-morbid illnesses, this patient is at least at moderate risk for complications without adequate follow up.  This format is felt to be most appropriate for this patient at this time.  The patient did not have access to video technology/had technical difficulties with video requiring transitioning to audio format only (telephone).  All issues noted in this document were discussed and addressed.  No physical exam could be performed with this format.  Please refer to the patient's chart for his  consent to telehealth for [facility].   Evaluation Performed:  Follow-up visit  Date:  05/17/2018   ID:  Brandon Gomez, DOB [birth year], MRN [id number]  Patient Location: Home  Provider Location: Home  PCP:  Leanna Battles, MD  Cardiologist:  Larae Grooms, MD  Electrophysiologist:  None   Chief Complaint:  PAC, PVCs  History of Present Illness:    Brandon Gomez is a 85 y.o. male who presents via audio/video conferencing for a telehealth visit today.    He  has had HTN and PACs. He has been checking his BP. Systolic BP is in the 657-846 range. Diastolic has been in the 96E for the most part but goes into the 80s sometimes. He has not felt PACs, but has noted them when he checks his pulse. He has not taken his Metoprolol in the past.  Works at [employer].  Stopped working this week due to virus.   He does not feel his PACs. He states the BP cuff occasionally informs him.   Denies : Chest pain. Dizziness. Leg edema. Nitroglycerin use. Orthopnea. Palpitations. Paroxysmal nocturnal dyspnea. Shortness of breath. Syncope.   The patient does not have symptoms concerning for COVID-19 infection (fever, chills, cough, or  new shortness of breath).    Past Medical History:  Diagnosis Date  . Bradycardia   . Hyperlipidemia   . Palpitations    Past Surgical History:  Procedure Laterality Date  . TRANSTHORACIC ECHOCARDIOGRAM  07/18/2008   EF-55-60%/no valvular abnormality/mitral inflow and tissue doppler consistent with impaired LV relaxation     Current Meds  Medication Sig  . aspirin 81 MG tablet Take 81 mg by mouth daily.  . Cholecalciferol (VITAMIN D) 2000 UNITS CAPS Take 1 capsule by mouth.  . diclofenac sodium (VOLTAREN) 1 % GEL Apply 2 g topically 4 (four) times daily.  Marland Kitchen lovastatin (MEVACOR) 40 MG tablet Take 20 mg by mouth at bedtime.  . methocarbamol (ROBAXIN) 500 MG tablet Take 1/2-1 tab once daily at night for muscle spasms  . methylPREDNISolone (MEDROL DOSEPAK) 4 MG TBPK tablet Take as directed  . Multiple Vitamin (MULTIVITAMIN) tablet Take 1 tablet by mouth daily.  Marland Kitchen neomycin-polymyxin-hydrocortisone (CORTISPORIN) 3.5-10000-1 otic suspension Place 1 drop into both ears daily.   . Omega-3 Fatty Acids (OMEGA 3 PO) Take 2 packets by mouth daily.   Marland Kitchen oseltamivir (TAMIFLU) 75 MG capsule Take 75 mg by mouth daily.  . vitamin B-12 (CYANOCOBALAMIN) 250 MCG tablet Take 250 mcg by mouth daily.     Allergies:   Patient has no known allergies.   Social History   Tobacco Use  . Smoking status: Never Smoker  . Smokeless tobacco: Never Used  Substance Use Topics  . Alcohol use: No    Alcohol/week: 0.0 standard  drinks  . Drug use: No     Family Hx: The patient's family history includes Diabetes in his father and mother; Heart attack in his brother, father, and mother; Heart disease in his sister; Hypertension in his father.  ROS:   Please see the history of present illness.     All other systems reviewed and are negative.   Prior CV studies:   The following studies were reviewed today:    Labs/Other Tests and Data Reviewed:    EKG:  NSR, no ST changes in 2019  Recent Labs: No  results found for requested labs within last 8760 hours. LDL 93 in 06/2018  Recent Lipid Panel No results found for: CHOL, TRIG, HDL, CHOLHDL, LDLCALC, LDLDIRECT  Wt Readings from Last 3 Encounters:  05/17/18 193 lb (87.5 kg)  03/24/17 193 lb 1.9 oz (87.6 kg)  12/27/15 191 lb 9.6 oz (86.9 kg)     Objective:    Vital Signs:  BP (!) 116/56   Pulse (!) 56   Ht 5\' 8"  (1.727 m)   Wt 193 lb (87.5 kg)   BMI 29.35 kg/m    Well nourished, well developed male in no acute distress. No shortness of breath  ASSESSMENT & PLAN:    1. PACs, PVCs:  Controlled. No sx.  COntinue preventive therapy.  2. Elevated BP: Controlled at home.  3. Recommended healthy diet and regular exercise.   COVID-19 Education: The signs and symptoms of COVID-19 were discussed with the patient and how to seek care for testing (follow up with PCP or arrange E-visit).  The importance of social distancing was discussed today.  Time:   Today, I have spent 15 minutes with the patient with telehealth technology discussing the above problems.     Medication Adjustments/Labs and Tests Ordered: Current medicines are reviewed at length with the patient today.  Concerns regarding medicines are outlined above.  Tests Ordered: No orders of the defined types were placed in this encounter.  Medication Changes: No orders of the defined types were placed in this encounter.   Disposition:  Follow up in 1 year(s)  Signed, Larae Grooms, MD  05/17/2018 12:31 PM    [place]

## 2018-06-29 DIAGNOSIS — R69 Illness, unspecified: Secondary | ICD-10-CM | POA: Diagnosis not present

## 2018-07-14 DIAGNOSIS — N183 Chronic kidney disease, stage 3 (moderate): Secondary | ICD-10-CM | POA: Diagnosis not present

## 2018-07-14 DIAGNOSIS — E7849 Other hyperlipidemia: Secondary | ICD-10-CM | POA: Diagnosis not present

## 2018-07-14 DIAGNOSIS — Z125 Encounter for screening for malignant neoplasm of prostate: Secondary | ICD-10-CM | POA: Diagnosis not present

## 2018-07-14 DIAGNOSIS — R82998 Other abnormal findings in urine: Secondary | ICD-10-CM | POA: Diagnosis not present

## 2018-07-21 DIAGNOSIS — I2510 Atherosclerotic heart disease of native coronary artery without angina pectoris: Secondary | ICD-10-CM | POA: Diagnosis not present

## 2018-07-21 DIAGNOSIS — Z1339 Encounter for screening examination for other mental health and behavioral disorders: Secondary | ICD-10-CM | POA: Diagnosis not present

## 2018-07-21 DIAGNOSIS — M25552 Pain in left hip: Secondary | ICD-10-CM | POA: Diagnosis not present

## 2018-07-21 DIAGNOSIS — Z1331 Encounter for screening for depression: Secondary | ICD-10-CM | POA: Diagnosis not present

## 2018-07-21 DIAGNOSIS — E7849 Other hyperlipidemia: Secondary | ICD-10-CM | POA: Diagnosis not present

## 2018-07-21 DIAGNOSIS — R351 Nocturia: Secondary | ICD-10-CM | POA: Diagnosis not present

## 2018-07-21 DIAGNOSIS — N183 Chronic kidney disease, stage 3 (moderate): Secondary | ICD-10-CM | POA: Diagnosis not present

## 2018-07-21 DIAGNOSIS — Z0000 Encounter for general adult medical examination without abnormal findings: Secondary | ICD-10-CM | POA: Diagnosis not present

## 2018-10-06 DIAGNOSIS — Z23 Encounter for immunization: Secondary | ICD-10-CM | POA: Diagnosis not present

## 2018-11-24 DIAGNOSIS — H04123 Dry eye syndrome of bilateral lacrimal glands: Secondary | ICD-10-CM | POA: Diagnosis not present

## 2018-11-24 DIAGNOSIS — H40013 Open angle with borderline findings, low risk, bilateral: Secondary | ICD-10-CM | POA: Diagnosis not present

## 2018-11-24 DIAGNOSIS — Z961 Presence of intraocular lens: Secondary | ICD-10-CM | POA: Diagnosis not present

## 2018-11-24 DIAGNOSIS — H26492 Other secondary cataract, left eye: Secondary | ICD-10-CM | POA: Diagnosis not present

## 2018-11-30 DIAGNOSIS — L821 Other seborrheic keratosis: Secondary | ICD-10-CM | POA: Diagnosis not present

## 2018-11-30 DIAGNOSIS — D2271 Melanocytic nevi of right lower limb, including hip: Secondary | ICD-10-CM | POA: Diagnosis not present

## 2018-11-30 DIAGNOSIS — D225 Melanocytic nevi of trunk: Secondary | ICD-10-CM | POA: Diagnosis not present

## 2018-11-30 DIAGNOSIS — D1801 Hemangioma of skin and subcutaneous tissue: Secondary | ICD-10-CM | POA: Diagnosis not present

## 2019-04-02 ENCOUNTER — Ambulatory Visit: Payer: Medicare HMO | Attending: Internal Medicine

## 2019-04-02 DIAGNOSIS — Z23 Encounter for immunization: Secondary | ICD-10-CM | POA: Insufficient documentation

## 2019-04-02 NOTE — Progress Notes (Signed)
   Covid-19 Vaccination Clinic  Name:  Brandon Gomez    MRN: [id number] DOB: [birth year]  04/02/2019  [name] was observed post Covid-19 immunization for 15 minutes without incidence. He was provided with Vaccine Information Sheet and instruction to access the V-Safe system.   [name] was instructed to call 911 with any severe reactions post vaccine: Marland Kitchen Difficulty breathing  . Swelling of your face and throat  . A fast heartbeat  . A bad rash all over your body  . Dizziness and weakness    Immunizations Administered    Name Date Dose VIS Date Route   Pfizer COVID-19 Vaccine 04/02/2019 11:40 AM 0.3 mL 01/20/2019 Intramuscular   Manufacturer: [place]   Lot: [id number]   [place]: [id number]

## 2019-04-26 ENCOUNTER — Ambulatory Visit: Payer: Medicare HMO | Attending: Internal Medicine

## 2019-04-26 DIAGNOSIS — Z23 Encounter for immunization: Secondary | ICD-10-CM

## 2019-04-26 NOTE — Progress Notes (Signed)
   Covid-19 Vaccination Clinic  Name:  Brandon Gomez    MRN: [id number] DOB: [birth year]  04/26/2019  [name] was observed post Covid-19 immunization for 15 minutes without incident. He was provided with Vaccine Information Sheet and instruction to access the V-Safe system.   [name] was instructed to call 911 with any severe reactions post vaccine: Marland Kitchen Difficulty breathing  . Swelling of face and throat  . A fast heartbeat  . A bad rash all over body  . Dizziness and weakness   Immunizations Administered    Name Date Dose VIS Date Route   Pfizer COVID-19 Vaccine 04/26/2019  8:37 AM 0.3 mL 01/20/2019 Intramuscular   Manufacturer: [place]   Lot: [id number]   [place]: [id number]

## 2019-06-11 IMAGING — CT CT CHEST W/O CM
2 of 4 series · 14 of 36 positions shown, 17 images · non-contrast
Comparison: 01/14/2017 and 01/08/2016.

CLINICAL DATA: Followup right middle lobe nodule.

EXAM:
CT CHEST WITHOUT CONTRAST
TECHNIQUE: Multidetector CT imaging of the chest was performed following the
standard protocol without IV contrast.

[Series 2: chest 2.00 br40 s3 ax · axial · 0.81mm/px · z∈[+1746,+1982]mm · 11 of 140 slices shown, 14 images]
[im 11/140  mediastinal]
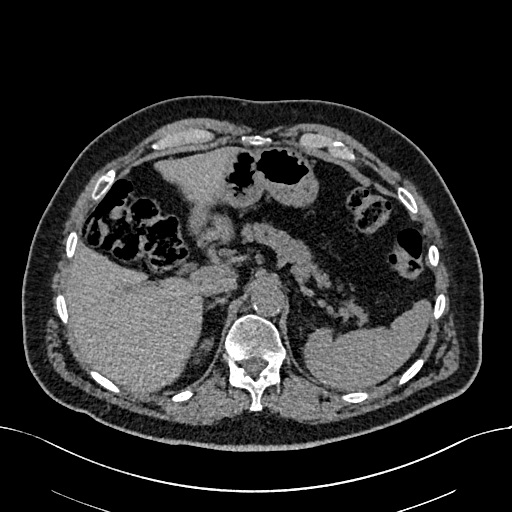
[im 11/140  lung]
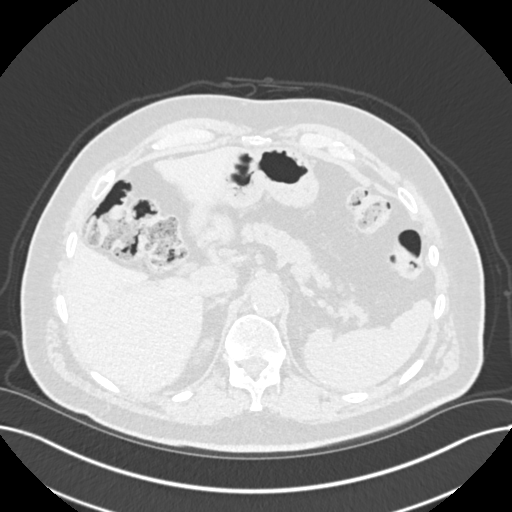
[im 22/140  lung]
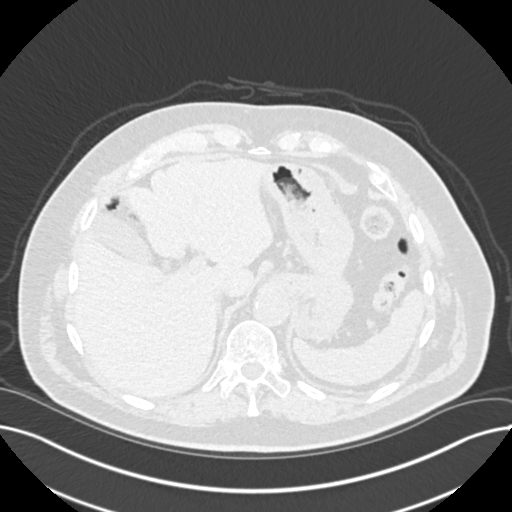
[im 33/140  lung]
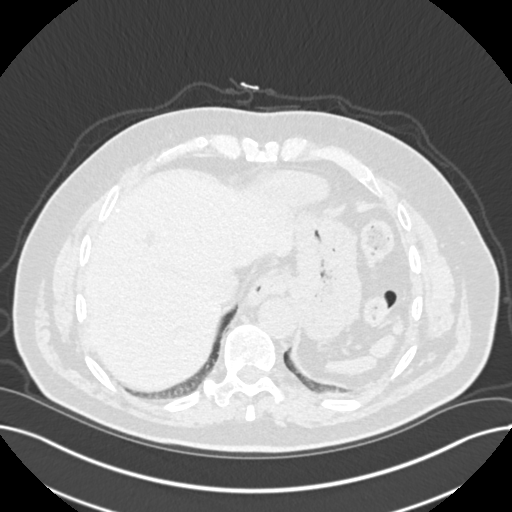
[im 43/140  lung]
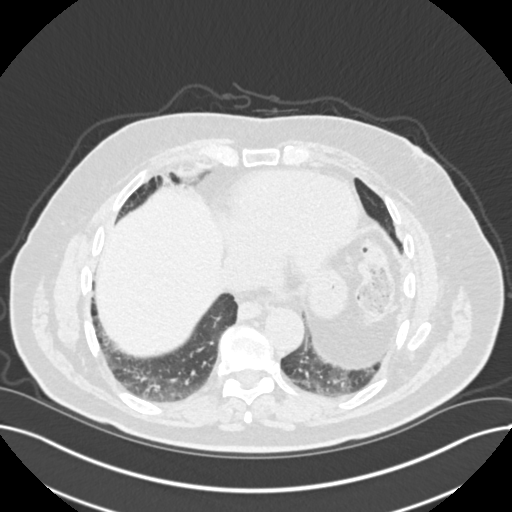
[im 54/140  mediastinal]
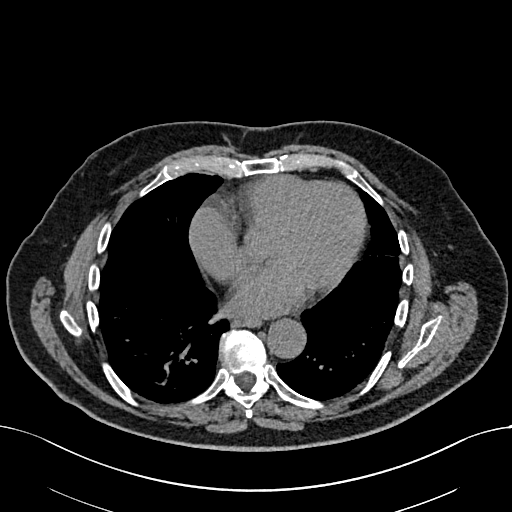
[im 54/140  lung]
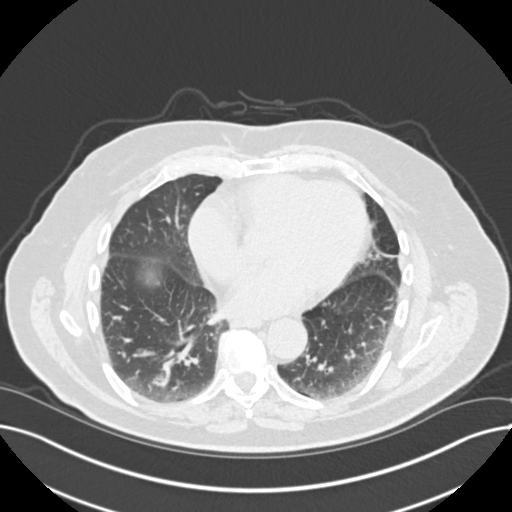
[im 75/140  lung]
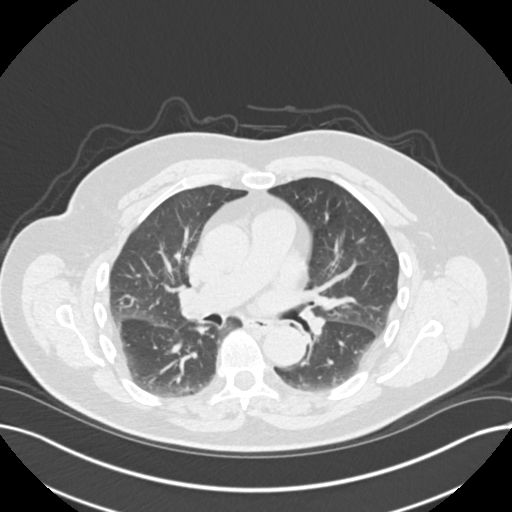
[im 86/140  lung]
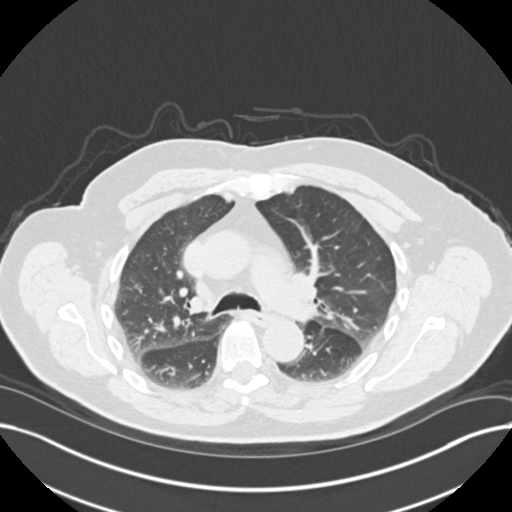
[im 97/140  lung]
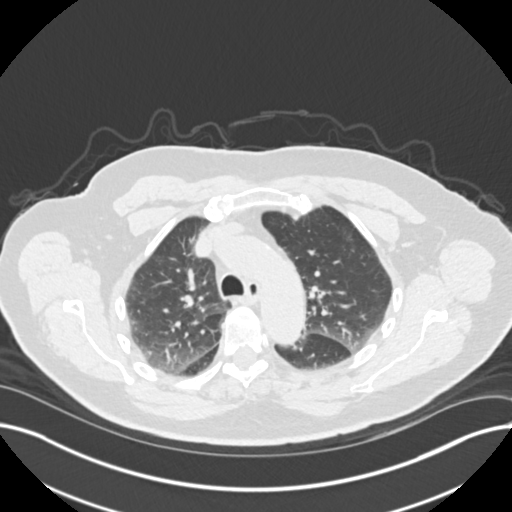
[im 107/140  mediastinal]
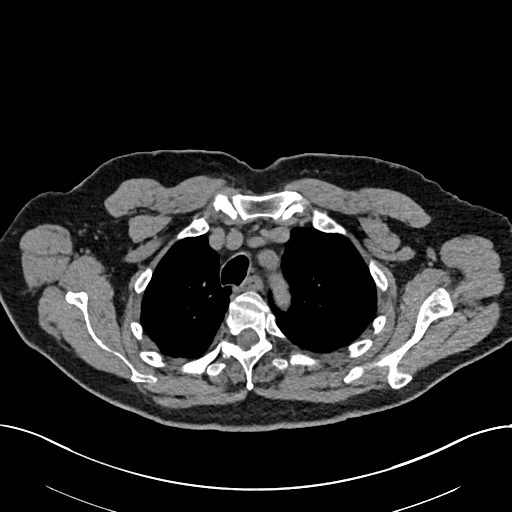
[im 107/140  lung]
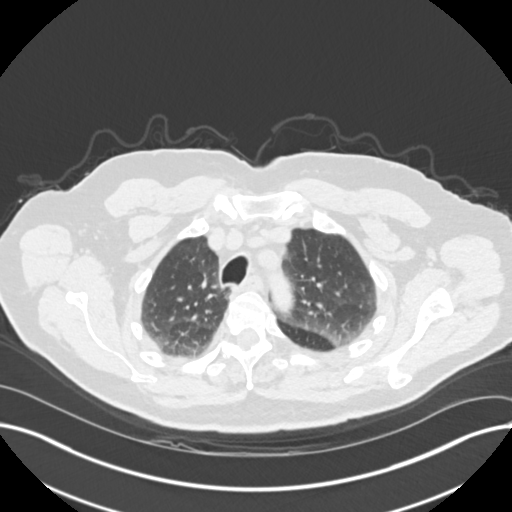
[im 118/140  lung]
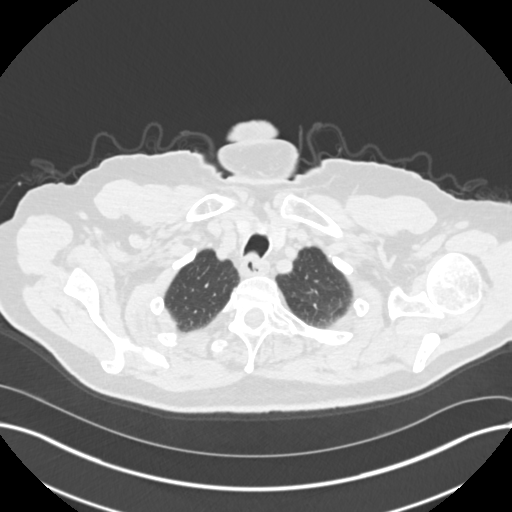
[im 129/140  lung]
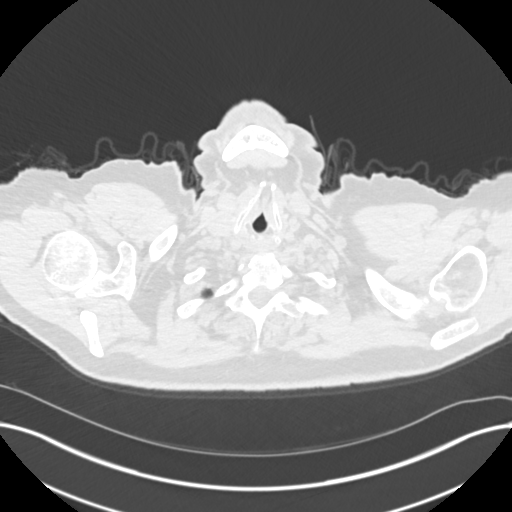

[Series 4: chest 2.00 br40 s3 cor · coronal · 0.55mm/px · 3 of 206 slices shown]
[im 42/206  lung]
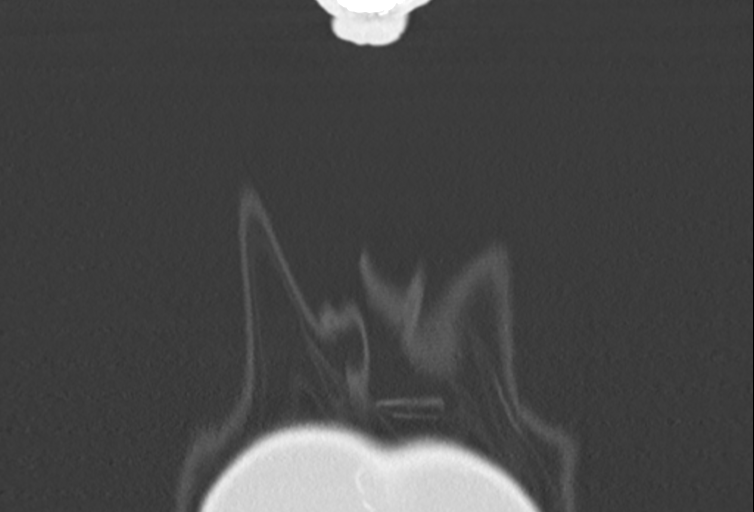
[im 83/206  lung]
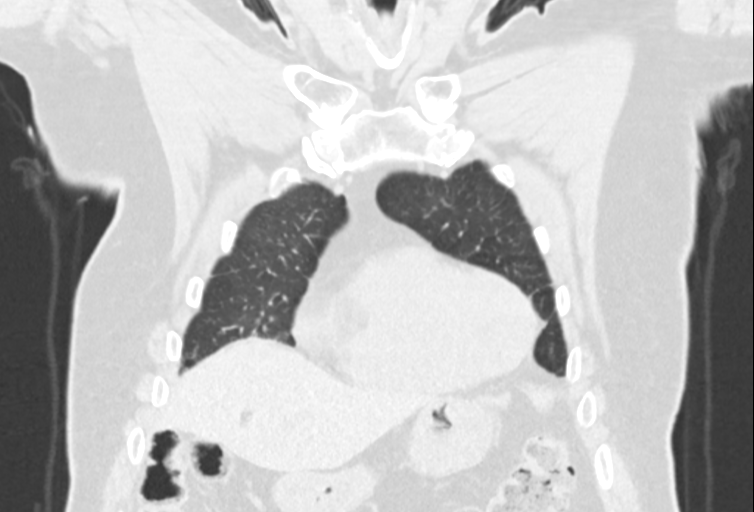
[im 124/206  lung]
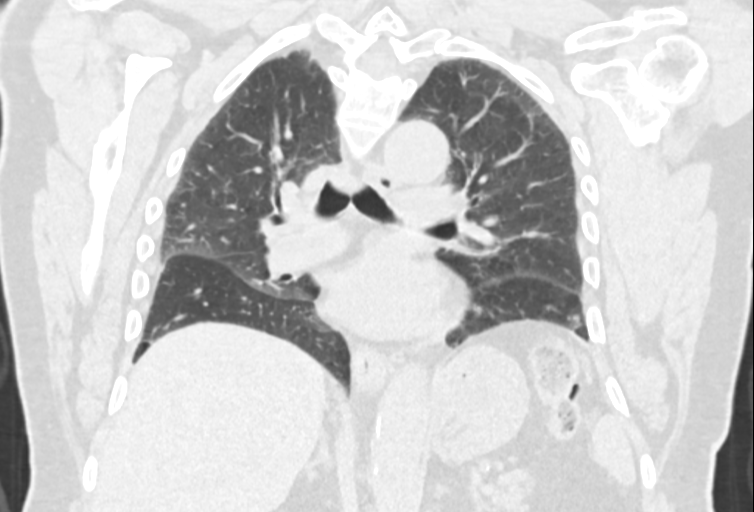

[14 of 36 positions shown; findings below may reference images not displayed]

FINDINGS: Cardiovascular: Atheromatous calcifications, including the coronary
arteries and aorta. Stable mildly enlarged heart. The ascending
thoracic aorta continues measure 3.7 cm in maximum diameter at the
level of the main pulmonary artery.

Mediastinum/Nodes: No enlarged mediastinal or axillary lymph nodes.
Thyroid gland, trachea, and esophagus demonstrate no significant
findings.

Lungs/Pleura: The previously demonstrated right middle lobe nodule
measures 5.7 mm in maximum diameter today on image number 76 series
8. This measured 8.1 mm on 01/14/2017 and 5.3 mm on 01/08/2016. This
continues to have a broad base against the overlying minor fissure.
No new nodules are seen. No pleural fluid. The interstitial markings
remain minimally prominent. Mild paraseptal bullous changes are
noted.

Upper Abdomen: Small liver cysts without significant change.

Musculoskeletal: Lower cervical spine degenerative changes and mild
lumbar spine degenerative changes. Bilateral sternoclavicular
degenerative changes.
IMPRESSION: 1. Stable right middle lobe nodule, as described above. The
long-term stability is compatible with a benign process, most likely
a normal subpleural lymph node. This does not need further follow-up
unless otherwise clinically indicated.
2. Mild changes of COPD.
3. Atheromatous calcifications, including the coronary arteries and
aorta.

Aortic Atherosclerosis (2OI2D-QR8.8) and Emphysema (2OI2D-4Z1.M).

## 2019-07-12 DIAGNOSIS — R69 Illness, unspecified: Secondary | ICD-10-CM | POA: Diagnosis not present

## 2019-07-20 DIAGNOSIS — R82998 Other abnormal findings in urine: Secondary | ICD-10-CM | POA: Diagnosis not present

## 2019-07-20 DIAGNOSIS — Z0000 Encounter for general adult medical examination without abnormal findings: Secondary | ICD-10-CM | POA: Diagnosis not present

## 2019-07-20 DIAGNOSIS — E7849 Other hyperlipidemia: Secondary | ICD-10-CM | POA: Diagnosis not present

## 2019-07-20 DIAGNOSIS — Z125 Encounter for screening for malignant neoplasm of prostate: Secondary | ICD-10-CM | POA: Diagnosis not present

## 2019-07-27 DIAGNOSIS — M25551 Pain in right hip: Secondary | ICD-10-CM | POA: Diagnosis not present

## 2019-07-27 DIAGNOSIS — E785 Hyperlipidemia, unspecified: Secondary | ICD-10-CM | POA: Diagnosis not present

## 2019-07-27 DIAGNOSIS — M25561 Pain in right knee: Secondary | ICD-10-CM | POA: Diagnosis not present

## 2019-07-27 DIAGNOSIS — R351 Nocturia: Secondary | ICD-10-CM | POA: Diagnosis not present

## 2019-07-27 DIAGNOSIS — I2510 Atherosclerotic heart disease of native coronary artery without angina pectoris: Secondary | ICD-10-CM | POA: Diagnosis not present

## 2019-07-27 DIAGNOSIS — Z1339 Encounter for screening examination for other mental health and behavioral disorders: Secondary | ICD-10-CM | POA: Diagnosis not present

## 2019-07-27 DIAGNOSIS — Z0000 Encounter for general adult medical examination without abnormal findings: Secondary | ICD-10-CM | POA: Diagnosis not present

## 2019-07-27 DIAGNOSIS — Z1331 Encounter for screening for depression: Secondary | ICD-10-CM | POA: Diagnosis not present

## 2019-07-27 DIAGNOSIS — M25511 Pain in right shoulder: Secondary | ICD-10-CM | POA: Diagnosis not present

## 2019-07-27 DIAGNOSIS — E669 Obesity, unspecified: Secondary | ICD-10-CM | POA: Diagnosis not present

## 2019-08-03 DIAGNOSIS — R69 Illness, unspecified: Secondary | ICD-10-CM | POA: Diagnosis not present

## 2019-08-25 ENCOUNTER — Telehealth: Payer: Self-pay | Admitting: Orthopaedic Surgery

## 2019-08-25 NOTE — Telephone Encounter (Signed)
Patient's wife called asking when will patient be set up for the MRI? Patient would like to go to [place]. The number to contact Adonis Huguenin is [phone]

## 2019-08-25 NOTE — Telephone Encounter (Signed)
I contacted pt wife and she stated she did not want the MRI and only mentioned that he needed to be seen for his knee where he has the pain. I informed pt this was a miscommunication and will just see him on Monday

## 2019-08-28 ENCOUNTER — Ambulatory Visit (INDEPENDENT_AMBULATORY_CARE_PROVIDER_SITE_OTHER): Payer: Medicare HMO

## 2019-08-28 ENCOUNTER — Ambulatory Visit: Payer: Medicare HMO | Admitting: Orthopaedic Surgery

## 2019-08-28 ENCOUNTER — Encounter: Payer: Self-pay | Admitting: Physician Assistant

## 2019-08-28 ENCOUNTER — Ambulatory Visit: Payer: Self-pay

## 2019-08-28 DIAGNOSIS — M25511 Pain in right shoulder: Secondary | ICD-10-CM

## 2019-08-28 DIAGNOSIS — M79644 Pain in right finger(s): Secondary | ICD-10-CM | POA: Diagnosis not present

## 2019-08-28 DIAGNOSIS — G8929 Other chronic pain: Secondary | ICD-10-CM | POA: Diagnosis not present

## 2019-08-28 DIAGNOSIS — M25512 Pain in left shoulder: Secondary | ICD-10-CM | POA: Diagnosis not present

## 2019-08-28 DIAGNOSIS — M25561 Pain in right knee: Secondary | ICD-10-CM

## 2019-08-28 MED ORDER — MELOXICAM 7.5 MG PO TABS
7.5000 mg | ORAL_TABLET | Freq: Every day | ORAL | 2 refills | Status: DC | PRN
Start: 2019-08-28 — End: 2022-05-18

## 2019-08-28 MED ORDER — METHYLPREDNISOLONE ACETATE 40 MG/ML IJ SUSP
40.0000 mg | INTRAMUSCULAR | Status: AC | PRN
  Administered 2019-08-28: 40 mg via INTRA_ARTICULAR

## 2019-08-28 MED ORDER — LIDOCAINE HCL 1 % IJ SOLN
2.0000 mL | INTRAMUSCULAR | Status: AC | PRN
  Administered 2019-08-28: 2 mL

## 2019-08-28 MED ORDER — BUPIVACAINE HCL 0.25 % IJ SOLN
2.0000 mL | INTRAMUSCULAR | Status: AC | PRN
  Administered 2019-08-28: 2 mL via INTRA_ARTICULAR

## 2019-08-28 MED ORDER — DICLOFENAC SODIUM 1 % EX GEL: 2.0000 g | Freq: Four times a day (QID) | CUTANEOUS | 0 refills | Status: DC

## 2019-08-28 MED ORDER — LIDOCAINE HCL 2 % IJ SOLN
2.0000 mL | INTRAMUSCULAR | Status: AC | PRN
Start: 2019-08-28 — End: 2019-08-28
  Administered 2019-08-28: 2 mL

## 2019-08-28 NOTE — Progress Notes (Signed)
Office Visit Note   Patient: Brandon Gomez           Date of Birth: 1933-04-07           MRN: [id number] Visit Date: 08/28/2019              Requested by: Leanna Battles, MD [place],  [place] [address] PCP: Leanna Battles, MD   Assessment & Plan: Visit Diagnoses:  1. Chronic pain of right knee   2. Chronic pain of both shoulders   3. 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  In regards to the right knee and right shoulder, we will inject both with cortisone today.  In regards to the right thumb, we will provide him with a removable thumb spica splint.  Have also called in Voltaren gel and pills.  He will follow-up with [place] as needed.  Follow-Up Instructions: Return if symptoms worsen or fail to improve.   Orders:  Orders Placed This Encounter  Procedures  . Large Joint Inj: R knee  . Large Joint Inj: R subacromial bursa  . XR KNEE 3 VIEW RIGHT  . XR Hand Complete Right  . XR Shoulder Right  . XR Shoulder Left   Meds ordered this encounter  Medications  . diclofenac Sodium (VOLTAREN) 1 % GEL    Sig: Apply 2 g topically 4 (four) times daily.    Dispense:  150 g    Refill:  0  . meloxicam (MOBIC) 7.5 MG tablet    Sig: Take 1 tablet (7.5 mg total) by mouth daily as needed for up to 14 doses for pain.    Dispense:  30 tablet    Refill:  2      Procedures: Large Joint Inj: R knee on 08/28/2019 9:46 AM Indications: pain Details: 22 G needle, anterolateral approach Medications: 2 mL lidocaine 1 %; 2 mL bupivacaine 0.25 %; 40 mg methylPREDNISolone acetate 40 MG/ML  Large Joint Inj: R subacromial bursa on 08/28/2019 9:46 AM Indications: pain Details: 22 G needle Medications: 2 mL lidocaine 2 %; 2 mL bupivacaine 0.25 %; 40 mg methylPREDNISolone acetate 40  MG/ML Outcome: tolerated well, no immediate complications Patient was prepped and draped in the usual sterile fashion.       Clinical Data: No additional findings.   Subjective: Chief Complaint  Patient presents with  . Right Knee - Pain    HPI patient is a very pleasant 86 year old right-hand-dominant gentleman who presents to our clinic today with right knee, right thumb and bilateral shoulder pain.  In regards to his right knee, he has had pain here for the past year after stepping in a knee deep hole while mowing his yard.  He did not have immediate pain following the injury but has noticed intermittent medial knee pain since.  He denies any locking or catching.  He has increased pain with flexion which has worsened since yesterday.  No new injury or change in activity.  He does take Advil as needed.  In regards to his thumb, he has had pain right along the first [facility] joint for the past several weeks.  Worse with any movement of the thumb.  He has been wearing a brace at times which does seem to help.  In regards to his shoulders, the right does appear  to be worse.  He has had pain here for several months.  Pain is worse when lifting the arms in a forward flexed or abducted position.  No weakness.  No numbness, tingling or burning.  Review of Systems as detailed in HPI.  All others reviewed and are negative.   Objective: Vital Signs: There were no vitals taken for this visit.  Physical Exam well-developed well-nourished gentleman in no acute distress.  Alert and oriented x3.  Ortho Exam examination of his right knee shows no effusion.  Range of motion from 0 to 100 degrees.  He does have moderate tenderness to the anterior medial joint line.  Mild patellofemoral crepitus.  Ligaments are stable.  Right hand exam shows moderate tenderness the first CMC joint.  Negative grind test.  Near full range of motion.  No tenderness to the first dorsal compartment.  Bilateral shoulder exam shows  approximately 75% range of motion in all planes.  Positive empty can.  Negative drop arm.  No tenderness to the [facility] joint.  He is neurovascular intact distally.  Specialty Comments:  No specialty comments available.  Imaging: XR Hand Complete Right  Result Date: 08/28/2019 Mild first [facility] joint degenerative changes  XR KNEE 3 VIEW RIGHT  Result Date: 08/28/2019 Mild tricompartmental degenerative changes.  No acute findings.  XR Shoulder Left  Result Date: 08/28/2019 Mild to moderate AC degenerative changes.  No other acute findings  XR Shoulder Right  Result Date: 08/28/2019 Mild to moderate AC degenerative changes.  No other acute findings    PMFS History: Patient Active Problem List   Diagnosis Date Noted  . Chronic low back pain without sciatica 09/16/2017  . PVC (premature ventricular contraction) 06/15/2013  . Elevated blood pressure reading without diagnosis of hypertension 06/15/2013   Past Medical History:  Diagnosis Date  . Bradycardia   . Hyperlipidemia   . Palpitations     Family History  Problem Relation Age of Onset  . Diabetes Father   . Heart attack Father   . Hypertension Father   . Heart attack Mother   . Diabetes Mother   . Heart attack Brother   . Heart disease Sister     Past Surgical History:  Procedure Laterality Date  . TRANSTHORACIC ECHOCARDIOGRAM  07/18/2008   EF-55-60%/no valvular abnormality/mitral inflow and tissue doppler consistent with impaired LV relaxation   Social History   Occupational History  . 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 Smoking status: Never Smoker  . Smokeless tobacco: Never Used  Vaping Use  . Vaping Use: Never used  Substance and Sexual Activity  . Alcohol use: No    Alcohol/week: 0.0 standard drinks  . Drug use: No  . Sexual activity: Not on file

## 2019-09-08 ENCOUNTER — Other Ambulatory Visit: Payer: Self-pay | Admitting: Internal Medicine

## 2019-09-08 DIAGNOSIS — M5136 Other intervertebral disc degeneration, lumbar region: Secondary | ICD-10-CM

## 2019-09-08 DIAGNOSIS — M25551 Pain in right hip: Secondary | ICD-10-CM

## 2019-09-08 DIAGNOSIS — M1610 Unilateral primary osteoarthritis, unspecified hip: Secondary | ICD-10-CM

## 2019-10-01 NOTE — Progress Notes (Signed)
Cardiology Office Note   Date:  10/03/2019   ID:  Brandon Gomez, DOB [birth year], MRN [id number]  PCP:  Leanna Battles, MD    No chief complaint on file.  PACs  Wt Readings from Last 3 Encounters:  10/03/19 188 lb (85.3 kg)  05/17/18 193 lb (87.5 kg)  03/24/17 193 lb 1.9 oz (87.6 kg)       History of Present Illness: Brandon Gomez is a 86 y.o. male  has had HTN and PACs. He has been checking his BP. Systolic BP is in the 149-702 range. Diastolic has been in the 63Z for the most part but goes into the 80s sometimes. He has not felt PACs, but has noted them when he checks his pulse. He has not taken his Metoprolol in the past.  Worked at [employer].Stopped working due to virus for only a short time.  Back to driving cars.   He does not feel his PACs. He states the BP cuff occasionally informs him.  Since the last visit, he had some shoulder pain, and had steroid shot in the shoulder and knee.  Had some vertigo after that.  It has improved.  No falls recently.  Denies : Chest pain.  Leg edema. Nitroglycerin use. Orthopnea. Palpitations. Paroxysmal nocturnal dyspnea. Shortness of breath. Syncope.        Past Medical History:  Diagnosis Date  . Bradycardia   . Hyperlipidemia   . Palpitations     Past Surgical History:  Procedure Laterality Date  . TRANSTHORACIC ECHOCARDIOGRAM  07/18/2008   EF-55-60%/no valvular abnormality/mitral inflow and tissue doppler consistent with impaired LV relaxation     Current Outpatient Medications  Medication Sig Dispense Refill  . aspirin 81 MG tablet Take 81 mg by mouth daily.    . Cholecalciferol (VITAMIN D) 2000 UNITS CAPS Take 1 capsule by mouth.    . diclofenac Sodium (VOLTAREN) 1 % GEL Apply 2 g topically 4 (four) times daily. 150 g 0  . lovastatin (MEVACOR) 40 MG tablet Take 20 mg by mouth at bedtime.  0  . meloxicam (MOBIC) 7.5 MG tablet Take 1 tablet (7.5 mg total) by mouth daily as needed for up to 14 doses for  pain. 30 tablet 2  . methocarbamol (ROBAXIN) 500 MG tablet Take 1/2-1 tab once daily at night for muscle spasms 10 tablet 0  . methylPREDNISolone (MEDROL DOSEPAK) 4 MG TBPK tablet Take as directed 21 tablet 0  . Multiple Vitamin (MULTIVITAMIN) tablet Take 1 tablet by mouth daily.    Marland Kitchen neomycin-polymyxin-hydrocortisone (CORTISPORIN) 3.5-10000-1 otic suspension Place 1 drop into both ears daily.   1  . Omega-3 Fatty Acids (OMEGA 3 PO) Take 2 packets by mouth daily.     Marland Kitchen oseltamivir (TAMIFLU) 75 MG capsule Take 75 mg by mouth daily.    . vitamin B-12 (CYANOCOBALAMIN) 250 MCG tablet Take 250 mcg by mouth daily.    . metoprolol tartrate (LOPRESSOR) 25 MG tablet Take 0.5 tablets (12.5 mg total) by mouth 2 (two) times daily as needed. 60 tablet 3   No current facility-administered medications for this visit.    Allergies:   Patient has no known allergies.    Social History:  The patient  reports that he has never smoked. He has never used smokeless tobacco. He reports that he does not drink alcohol and does not use drugs.   Family History:  The patient's family history includes Diabetes in his father and mother; Heart attack in his brother, father, and  mother; Heart disease in his sister; Hypertension in his father.    ROS:  Please see the history of present illness.   Otherwise, review of systems are positive for occasional leg pains.   All other systems are reviewed and negative.    PHYSICAL EXAM: VS:  BP 130/70   Pulse 65   Ht 5\' 7"  (1.702 m)   Wt 188 lb (85.3 kg)   SpO2 96%   BMI 29.44 kg/m  , BMI Body mass index is 29.44 kg/m. GEN: Well nourished, well developed, in no acute distress  HEENT: normal  Neck: no JVD, carotid bruits, or masses Cardiac: RRR; no murmurs, rubs, or gallops,no edema  Respiratory:  clear to auscultation bilaterally, normal work of breathing GI: soft, nontender, nondistended, + BS MS: no deformity or atrophy ; 2+ PT pulse on right, 2+ DP pulse on  left Skin: warm and dry, no rash Neuro:  Strength and sensation are intact Psych: euthymic mood, full affect   EKG:   The ekg ordered today demonstrates NSR, no ST changes   Recent Labs: No results found for requested labs within last 8760 hours.   Lipid Panel No results found for: CHOL, TRIG, HDL, CHOLHDL, VLDL, LDLCALC, LDLDIRECT   Other studies Reviewed: Additional studies/ records that were reviewed today with results demonstrating: Labs reviewed.   ASSESSMENT AND PLAN:  1. PACs, PVCs: No sx. continue current medications.  No signs of atrial fibrillation. 2. Leg pain:  I don't think this is a vascular issue.  Palpable pedal pulses bilaterally. 3. Elevated BP: I reviewed all of his home readings.  Blood pressures at home are well controlled. 4. Hyperlipidemia: LDL 73 in June 2021.  Continue lovastatin. 5. Whole food plant based diet recommended.   Current medicines are reviewed at length with the patient today.  The patient concerns regarding his medicines were addressed.  The following changes have been made:  No change  Labs/ tests ordered today include:   Orders Placed This Encounter  Procedures  . EKG 12-Lead    Recommend 150 minutes/week of aerobic exercise Low fat, low carb, high fiber diet recommended  Disposition:   FU in 1 year   Signed, Larae Grooms, MD  10/03/2019 11:03 AM    [facility] [place], [place], [place]  [address] Phone: [phone]; Fax: [phone]

## 2019-10-03 ENCOUNTER — Ambulatory Visit: Payer: Medicare HMO | Admitting: Interventional Cardiology

## 2019-10-03 ENCOUNTER — Other Ambulatory Visit: Payer: Self-pay

## 2019-10-03 ENCOUNTER — Encounter: Payer: Self-pay | Admitting: Interventional Cardiology

## 2019-10-03 VITALS — BP 130/70 | HR 65 | Ht 67.0 in | Wt 188.0 lb

## 2019-10-03 DIAGNOSIS — I491 Atrial premature depolarization: Secondary | ICD-10-CM

## 2019-10-03 DIAGNOSIS — M79604 Pain in right leg: Secondary | ICD-10-CM

## 2019-10-03 DIAGNOSIS — M79605 Pain in left leg: Secondary | ICD-10-CM

## 2019-10-03 DIAGNOSIS — I493 Ventricular premature depolarization: Secondary | ICD-10-CM | POA: Diagnosis not present

## 2019-10-03 DIAGNOSIS — R030 Elevated blood-pressure reading, without diagnosis of hypertension: Secondary | ICD-10-CM

## 2019-10-03 NOTE — Patient Instructions (Signed)

## 2019-11-11 DIAGNOSIS — Z23 Encounter for immunization: Secondary | ICD-10-CM | POA: Diagnosis not present

## 2019-11-30 DIAGNOSIS — H40013 Open angle with borderline findings, low risk, bilateral: Secondary | ICD-10-CM | POA: Diagnosis not present

## 2019-11-30 DIAGNOSIS — H26492 Other secondary cataract, left eye: Secondary | ICD-10-CM | POA: Diagnosis not present

## 2019-11-30 DIAGNOSIS — H35373 Puckering of macula, bilateral: Secondary | ICD-10-CM | POA: Diagnosis not present

## 2019-11-30 DIAGNOSIS — H04123 Dry eye syndrome of bilateral lacrimal glands: Secondary | ICD-10-CM | POA: Diagnosis not present

## 2019-12-06 DIAGNOSIS — L821 Other seborrheic keratosis: Secondary | ICD-10-CM | POA: Diagnosis not present

## 2019-12-06 DIAGNOSIS — L814 Other melanin hyperpigmentation: Secondary | ICD-10-CM | POA: Diagnosis not present

## 2019-12-06 DIAGNOSIS — D692 Other nonthrombocytopenic purpura: Secondary | ICD-10-CM | POA: Diagnosis not present

## 2019-12-06 DIAGNOSIS — D225 Melanocytic nevi of trunk: Secondary | ICD-10-CM | POA: Diagnosis not present

## 2019-12-22 ENCOUNTER — Other Ambulatory Visit: Payer: Self-pay

## 2019-12-22 ENCOUNTER — Encounter: Payer: Self-pay | Admitting: Orthopaedic Surgery

## 2019-12-22 ENCOUNTER — Ambulatory Visit: Payer: Medicare HMO | Admitting: Orthopaedic Surgery

## 2019-12-22 DIAGNOSIS — G5602 Carpal tunnel syndrome, left upper limb: Secondary | ICD-10-CM | POA: Insufficient documentation

## 2019-12-22 DIAGNOSIS — G5601 Carpal tunnel syndrome, right upper limb: Secondary | ICD-10-CM

## 2019-12-22 NOTE — Progress Notes (Signed)
Office Visit Note   Patient: Brandon Gomez           Date of Birth: 01/12/1934           MRN: [id number] Visit Date: 12/22/2019              Requested by: Leanna Battles, MD [place],  [place] [address] PCP: Leanna Battles, MD   Assessment & Plan: Visit Diagnoses:  1. Right carpal tunnel syndrome   2. Left carpal tunnel syndrome     Plan: Impression is suspected bilateral carpal tunnel syndrome worse on the right hand.  We will obtain nerve conduction studies at this time.  We will hold off on injections until we get these results back.  We will see the patient back after the nerve conduction studies.  Follow-Up Instructions: Return if symptoms worsen or fail to improve.   Orders:  No orders of the defined types were placed in this encounter.  No orders of the defined types were placed in this encounter.     Procedures: No procedures performed   Clinical Data: No additional findings.   Subjective: Chief Complaint  Patient presents with  . Right Hand - Pain, Numbness  . Left Hand - Pain, Numbness    Brandon Gomez is a very pleasant 86 year old gentleman who comes in for evaluation of bilateral hand pain and numbness and tingling for the last 6 months that is worse at night.  The symptoms are worse in the right hand.  He has pain when making a fist and he feels weak.  He has been wearing some nighttime braces which help mildly.  He has been taking Tylenol as needed.  He often feels like the hands fall asleep.   Review of Systems  Constitutional: Negative.   All other systems reviewed and are negative.    Objective: Vital Signs: There were no vitals taken for this visit.  Physical Exam Vitals and nursing note reviewed.  Constitutional:      Appearance: He is well-developed.  Pulmonary:     Effort: Pulmonary effort is normal.  Abdominal:     Palpations: Abdomen is soft.  Skin:    General: Skin is warm.  Neurological:     Mental  Status: He is alert and oriented to person, place, and time.  Psychiatric:        Behavior: Behavior normal.        Thought Content: Thought content normal.        Judgment: Judgment normal.     Ortho Exam Bilateral hands show slight atrophy of the thenar compartment.  Positive carpal tunnel compressive signs of the right hand.  No neurovascular compromise.  Specialty Comments:  No specialty comments available.  Imaging: No results found.   PMFS History: Patient Active Problem List   Diagnosis Date Noted  . Right carpal tunnel syndrome 12/22/2019  . Left carpal tunnel syndrome 12/22/2019  . Chronic low back pain without sciatica 09/16/2017  . PVC (premature ventricular contraction) 06/15/2013  . Elevated blood pressure reading without diagnosis of hypertension 06/15/2013   Past Medical History:  Diagnosis Date  . Bradycardia   . Hyperlipidemia   . Palpitations     Family History  Problem Relation Age of Onset  . Diabetes Father   . Heart attack Father   . Hypertension Father   . Heart attack Mother   . Diabetes Mother   . Heart attack Brother   . Heart disease Sister  Past Surgical History:  Procedure Laterality Date  . TRANSTHORACIC ECHOCARDIOGRAM  07/18/2008   EF-55-60%/no valvular abnormality/mitral inflow and tissue doppler consistent with impaired LV relaxation   Social History   Occupational History  . 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 Smoking status: Never Smoker  . Smokeless tobacco: Never Used  Vaping Use  . Vaping Use: Never used  Substance and Sexual Activity  . Alcohol use: No    Alcohol/week: 0.0 standard drinks  . Drug use: No  . Sexual activity: Not on file

## 2020-01-02 ENCOUNTER — Other Ambulatory Visit: Payer: Self-pay

## 2020-01-02 ENCOUNTER — Encounter: Payer: Self-pay | Admitting: Diagnostic Neuroimaging

## 2020-01-02 ENCOUNTER — Ambulatory Visit: Payer: Medicare HMO | Admitting: Diagnostic Neuroimaging

## 2020-01-02 VITALS — BP 139/73 | HR 50 | Ht 67.0 in | Wt 195.0 lb

## 2020-01-02 DIAGNOSIS — M79642 Pain in left hand: Secondary | ICD-10-CM | POA: Diagnosis not present

## 2020-01-02 DIAGNOSIS — R200 Anesthesia of skin: Secondary | ICD-10-CM | POA: Diagnosis not present

## 2020-01-02 DIAGNOSIS — M79641 Pain in right hand: Secondary | ICD-10-CM

## 2020-01-02 NOTE — Progress Notes (Signed)
[facility]  PATIENT: Brandon Gomez DOB: 1933-10-14  REFERRING CLINICIAN: Leanna Battles, MD HISTORY FROM: patient  REASON FOR VISIT: new consult    HISTORICAL  CHIEF COMPLAINT:  Chief Complaint  Patient presents with  . Bilateral Carpal Tunnel    rm 7 New Pt "6 months of aching pain in both inner wrists; skin feels like nerves are on the edge, can hardly bend them, my wrists are weak, has gotten worse"    HISTORY OF PRESENT ILLNESS:   86 year old male here for evaluation of bilateral hand pain.  Symptoms started 1 to 2 years ago.  He describes stiffness and tightness in his bilateral fingers, hands and wrists.  He also has some numbness and tingling in his fingers.  Patient went to orthopedic clinic for evaluation and then referred here for carpal tunnel syndrome evaluation with electrodiagnostic testing.   REVIEW OF SYSTEMS: Full 14 system review of systems performed and negative with exception of: As per HPI.  ALLERGIES: No Known Allergies  HOME MEDICATIONS: Outpatient Medications Prior to Visit  Medication Sig Dispense Refill  . aspirin 81 MG tablet Take 81 mg by mouth daily.    . Cholecalciferol (VITAMIN D) 2000 UNITS CAPS Take 1 capsule by mouth.    . diclofenac Sodium (VOLTAREN) 1 % GEL Apply 2 g topically 4 (four) times daily. 150 g 0  . lovastatin (MEVACOR) 40 MG tablet Take 20 mg by mouth at bedtime.  0  . Multiple Vitamin (MULTIVITAMIN) tablet Take 1 tablet by mouth daily.    . Omega-3 Fatty Acids (OMEGA 3 PO) Take 2 packets by mouth daily.     . vitamin B-12 (CYANOCOBALAMIN) 250 MCG tablet Take 250 mcg by mouth daily.    . meloxicam (MOBIC) 7.5 MG tablet Take 1 tablet (7.5 mg total) by mouth daily as needed for up to 14 doses for pain. (Patient not taking: Reported on 01/02/2020) 30 tablet 2  . methocarbamol (ROBAXIN) 500 MG tablet Take 1/2-1 tab once daily at night for muscle spasms (Patient not taking: Reported on 01/02/2020) 10 tablet  0  . metoprolol tartrate (LOPRESSOR) 25 MG tablet Take 0.5 tablets (12.5 mg total) by mouth 2 (two) times daily as needed. 60 tablet 3  . neomycin-polymyxin-hydrocortisone (CORTISPORIN) 3.5-10000-1 otic suspension Place 1 drop into both ears daily.  (Patient not taking: Reported on 01/02/2020)  1  . methylPREDNISolone (MEDROL DOSEPAK) 4 MG TBPK tablet Take as directed 21 tablet 0  . oseltamivir (TAMIFLU) 75 MG capsule Take 75 mg by mouth daily.     No facility-administered medications prior to visit.    PAST MEDICAL HISTORY: Past Medical History:  Diagnosis Date  . Bradycardia   . Carpal tunnel syndrome   . Hyperlipidemia   . Palpitations     PAST SURGICAL HISTORY: Past Surgical History:  Procedure Laterality Date  . TRANSTHORACIC ECHOCARDIOGRAM  07/18/2008, 2020   EF-55-60%/no valvular abnormality/mitral inflow and tissue doppler consistent with impaired LV relaxation    FAMILY HISTORY: Family History  Problem Relation Age of Onset  . Diabetes Father   . Heart attack Father   . Hypertension Father   . Heart attack Mother   . Diabetes Mother   . Heart attack Brother   . Heart disease Sister     SOCIAL HISTORY: Social History   Socioeconomic History  . Marital status: Married    Spouse name: Adonis Huguenin  . Number of children: 1  . Years of education: Not on file  . Highest  education level: Not on file  Occupational History  . 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 Smoking status: Never Smoker  . Smokeless tobacco: Never Used  Vaping Use  . Vaping Use: Never used  Substance and Sexual Activity  . Alcohol use: No    Alcohol/week: 0.0 standard drinks  . Drug use: No  . Sexual activity: Not on file  Other Topics Concern  . Not on file  Social History Narrative   Lives with wife   Social Determinants of Health   Financial Resource Strain:   . Difficulty of Paying Living Expenses: Not  on file  Food Insecurity:   . Worried About Charity fundraiser in the Last Year: Not on file  . Ran Out of Food in the Last Year: Not on file  Transportation Needs:   . Lack of Transportation (Medical): Not on file  . Lack of Transportation (Non-Medical): Not on file  Physical Activity:   . Days of Exercise per Week: Not on file  . Minutes of Exercise per Session: Not on file  Stress:   . Feeling of Stress : Not on file  Social Connections:   . Frequency of Communication with Friends and Family: Not on file  . Frequency of Social Gatherings with Friends and Family: Not on file  . Attends Religious Services: Not on file  . Active Member of Clubs or Organizations: Not on file  . Attends Archivist Meetings: Not on file  . Marital Status: Not on file  Intimate Partner Violence:   . Fear of Current or Ex-Partner: Not on file  . Emotionally Abused: Not on file  . Physically Abused: Not on file  . Sexually Abused: Not on file     PHYSICAL EXAM  GENERAL EXAM/CONSTITUTIONAL: Vitals:  Vitals:   01/02/20 0803  BP: 139/73  Pulse: (!) 50  Weight: 195 lb (88.5 kg)  Height: '5\' 7"'  (1.702 m)     Body mass index is 30.54 kg/m. Wt Readings from Last 3 Encounters:  01/02/20 195 lb (88.5 kg)  10/03/19 188 lb (85.3 kg)  05/17/18 193 lb (87.5 kg)     Patient is in no distress; well developed, nourished and groomed; neck is supple  CARDIOVASCULAR:  Examination of carotid arteries is normal; no carotid bruits  Regular rate and rhythm, no murmurs  Examination of peripheral vascular system by observation and palpation is normal  EYES:  Ophthalmoscopic exam of optic discs and posterior segments is normal; no papilledema or hemorrhages  No exam data present  MUSCULOSKELETAL:  Gait, strength, tone, movements noted in Neurologic exam below  NEUROLOGIC: MENTAL STATUS:  No flowsheet data found.  awake, alert, oriented to person, place and time  recent and remote  memory intact  normal attention and concentration  language fluent, comprehension intact, naming intact  fund of knowledge appropriate  CRANIAL NERVE:   2nd - no papilledema on fundoscopic exam  2nd, 3rd, 4th, 6th - pupils equal and reactive to light, visual fields full to confrontation, extraocular muscles intact, no nystagmus  5th - facial sensation symmetric  7th - facial strength symmetric  8th - hearing intact  9th - palate elevates symmetrically, uvula midline  11th - shoulder shrug symmetric  12th - tongue protrusion midline  MOTOR:   normal bulk and tone, full strength in the BUE, BLE; EXCEPT ATROPHY AND WEAKNESS IN [place]  ROM (ACTIVE AND PASSIVE) IN FINGERS / HANDS / WRISTS  SENSORY:   normal and symmetric to light touch, pinprick, temperature, vibration; EXCEPT DECR IN FINGERTIPS DIGITS 1-3  COORDINATION:   finger-nose-finger, fine finger movements normal  REFLEXES:   deep tendon reflexes TRACE and symmetric  GAIT/STATION:   narrow based gait     DIAGNOSTIC DATA (LABS, IMAGING, TESTING) - I reviewed patient records, labs, notes, testing and imaging myself where available.  Lab Results  Component Value Date   WBC 5.3 07/06/2008   HGB 14.6 07/06/2008   HCT 43.3 07/06/2008   MCV 92.0 07/06/2008   PLT 169 07/06/2008      Component Value Date/Time   NA 142 07/06/2008 1546   K 3.9 07/06/2008 1546   CL 105 07/06/2008 1546   CO2 25 07/06/2008 1546   GLUCOSE 115 (H) 07/06/2008 1546   BUN 15 07/06/2008 1546   CREATININE 1.2 07/06/2008 1546   CALCIUM 9.4 07/06/2008 1546   GFRNONAA 60 (L) 07/06/2008 1546   GFRAA  07/06/2008 1546    >60        The eGFR has been calculated using the MDRD equation. This calculation has not been validated in all clinical situations. eGFR's persistently <60 mL/min signify possible Chronic Kidney Disease.   No results found for: CHOL, HDL, LDLCALC, LDLDIRECT, TRIG, CHOLHDL No results  found for: HGBA1C No results found for: VITAMINB12 No results found for: TSH     ASSESSMENT AND PLAN  86 y.o. year old male here with:  Dx:  1. Bilateral hand pain   2. Bilateral hand numbness      PLAN:  BILATERAL HAND PAIN / NUMBNESS (arthritis + bilateral carpal tunnel) - will setup EMG/NCS  Orders Placed This Encounter  Procedures  . NCV with EMG(electromyography)   Return for for NCV/EMG.    Penni Bombard, MD 28/76/8115, 7:26 AM Certified in Neurology, Neurophysiology and Neuroimaging  [facility] [address], [place], [place] [address] [phone]

## 2020-01-18 ENCOUNTER — Ambulatory Visit (INDEPENDENT_AMBULATORY_CARE_PROVIDER_SITE_OTHER): Payer: Medicare HMO | Admitting: Diagnostic Neuroimaging

## 2020-01-18 ENCOUNTER — Other Ambulatory Visit: Payer: Self-pay

## 2020-01-18 ENCOUNTER — Encounter: Payer: Medicare HMO | Admitting: Diagnostic Neuroimaging

## 2020-01-18 DIAGNOSIS — Z0289 Encounter for other administrative examinations: Secondary | ICD-10-CM

## 2020-01-18 DIAGNOSIS — M79641 Pain in right hand: Secondary | ICD-10-CM | POA: Diagnosis not present

## 2020-01-18 DIAGNOSIS — R200 Anesthesia of skin: Secondary | ICD-10-CM

## 2020-01-18 DIAGNOSIS — M79642 Pain in left hand: Secondary | ICD-10-CM | POA: Diagnosis not present

## 2020-01-18 NOTE — Procedures (Signed)
[facility]  NCS (NERVE CONDUCTION STUDY) WITH EMG (ELECTROMYOGRAPHY) REPORT   STUDY DATE: 01/18/20 PATIENT NAME: Brandon Gomez DOB: 10-04-1933 MRN: [id number]  ORDERING CLINICIAN: Andrey Spearman, MD   TECHNOLOGIST: Sherre Scarlet ELECTROMYOGRAPHER: Earlean Polka. Shaneen Reeser, MD  CLINICAL INFORMATION: 86 year old male with right greater than left hand numbness.  FINDINGS: NERVE CONDUCTION STUDY: Right median motor responses prolonged distal latency (6.19ms), decreased amplitude, normal conduction velocity.  Left median and right ulnar motor responses are normal.  Right median sensory sponsors prolonged peak latency and decreased amplitude.  Left median and bilateral ulnar sensory responses are normal.  Left median to ulnar transcarpal comparison study is normal.  Right ulnar F-wave latency is normal.    NEEDLE ELECTROMYOGRAPHY:  Needle examination of right upper extremity is normal.   IMPRESSION:   Abnormal study demonstrating: -Mild right median neuropathy at the wrist consistent mild right carpal tunnel syndrome.    INTERPRETING PHYSICIAN:  Penni Bombard, MD Certified in Neurology, Neurophysiology and Neuroimaging  [facility] [address], [place], [place] [address] [phone]   [facility]    Nerve / Sites Muscle Latency Ref. Amplitude Ref. Rel Amp Segments Distance Velocity Ref. Area    ms ms mV mV %  cm m/s m/s mVms  R Median - APB     Wrist APB 6.8 ?4.4 1.3 ?4.0 100 Wrist - APB 7   4.6     Upper arm APB 11.0  1.5  116 Upper arm - Wrist 22 52 ?49 5.9  L Median - APB     Wrist APB 4.2 ?4.4 7.0 ?4.0 100 Wrist - APB 7   24.1     Upper arm APB 8.0  6.9  98.3 Upper arm - Wrist 21 55 ?49 24.6  R Ulnar - ADM     Wrist ADM 3.1 ?3.3 8.7 ?6.0 100 Wrist - ADM 7   27.3     B.Elbow ADM 6.1  8.2  94.9 B.Elbow - Wrist 19 63 ?49 26.6     A.Elbow ADM 7.8  7.5  91.4 A.Elbow - B.Elbow 10 59 ?49 25.9         A.Elbow - Wrist                SNC    Nerve / Sites Rec. Site Peak Lat Ref.  Amp Ref. Segments Distance Peak Diff Ref.    ms ms V V  cm ms ms  L Median, Ulnar - Transcarpal comparison     Median Palm Wrist 2.5 ?2.2 38 ?35 Median Palm - Wrist 8       Ulnar Palm Wrist 2.2 ?2.2 19 ?12 Ulnar Palm - Wrist 8          Median Palm - Ulnar Palm  0.3 ?0.4  R Median - Orthodromic (Dig II, Mid palm)     Dig II Wrist 3.8 ?3.4 4 ?10 Dig II - Wrist 13    L Median - Orthodromic (Dig II, Mid palm)     Dig II Wrist 3.4 ?3.4 13 ?10 Dig II - Wrist 13    R Ulnar - Orthodromic, (Dig V, Mid palm)     Dig V Wrist 2.6 ?3.1 5 ?5 Dig V - Wrist 11    L Ulnar - Orthodromic, (Dig V, Mid palm)     Dig V Wrist 3.0 ?3.1 9 ?5 Dig V - Wrist 11  F  Wave    Nerve F Lat Ref.   ms ms  R Ulnar - ADM 28.3 ?32.0       EMG Summary Table    Spontaneous MUAP Recruitment  Muscle IA Fib PSW Fasc Other Amp Dur. Poly Pattern  R. Deltoid Normal None None None _______ Normal Normal Normal Normal  R. Biceps brachii Normal None None None _______ Normal Normal Normal Normal  R. Triceps brachii Normal None None None _______ Normal Normal Normal Normal  R. Flexor carpi radialis Normal None None None _______ Normal Normal Normal Normal  R. First dorsal interosseous Normal None None None _______ Normal Normal Normal Normal

## 2020-01-25 ENCOUNTER — Encounter: Payer: Medicare HMO | Admitting: Diagnostic Neuroimaging

## 2020-01-25 DIAGNOSIS — G5601 Carpal tunnel syndrome, right upper limb: Secondary | ICD-10-CM | POA: Diagnosis not present

## 2020-01-25 DIAGNOSIS — H8112 Benign paroxysmal vertigo, left ear: Secondary | ICD-10-CM | POA: Diagnosis not present

## 2020-01-25 DIAGNOSIS — I2510 Atherosclerotic heart disease of native coronary artery without angina pectoris: Secondary | ICD-10-CM | POA: Diagnosis not present

## 2020-01-25 DIAGNOSIS — M25511 Pain in right shoulder: Secondary | ICD-10-CM | POA: Diagnosis not present

## 2020-08-08 DIAGNOSIS — Z125 Encounter for screening for malignant neoplasm of prostate: Secondary | ICD-10-CM | POA: Diagnosis not present

## 2020-08-08 DIAGNOSIS — E785 Hyperlipidemia, unspecified: Secondary | ICD-10-CM | POA: Diagnosis not present

## 2020-08-08 DIAGNOSIS — R82998 Other abnormal findings in urine: Secondary | ICD-10-CM | POA: Diagnosis not present

## 2020-08-15 DIAGNOSIS — M5136 Other intervertebral disc degeneration, lumbar region: Secondary | ICD-10-CM | POA: Diagnosis not present

## 2020-08-15 DIAGNOSIS — R3911 Hesitancy of micturition: Secondary | ICD-10-CM | POA: Diagnosis not present

## 2020-08-15 DIAGNOSIS — I700 Atherosclerosis of aorta: Secondary | ICD-10-CM | POA: Diagnosis not present

## 2020-08-15 DIAGNOSIS — E785 Hyperlipidemia, unspecified: Secondary | ICD-10-CM | POA: Diagnosis not present

## 2020-08-15 DIAGNOSIS — Z0000 Encounter for general adult medical examination without abnormal findings: Secondary | ICD-10-CM | POA: Diagnosis not present

## 2020-08-15 DIAGNOSIS — G5601 Carpal tunnel syndrome, right upper limb: Secondary | ICD-10-CM | POA: Diagnosis not present

## 2020-08-15 DIAGNOSIS — M19041 Primary osteoarthritis, right hand: Secondary | ICD-10-CM | POA: Diagnosis not present

## 2020-08-15 DIAGNOSIS — I2510 Atherosclerotic heart disease of native coronary artery without angina pectoris: Secondary | ICD-10-CM | POA: Diagnosis not present

## 2020-08-15 DIAGNOSIS — M25561 Pain in right knee: Secondary | ICD-10-CM | POA: Diagnosis not present

## 2020-08-15 DIAGNOSIS — M25511 Pain in right shoulder: Secondary | ICD-10-CM | POA: Diagnosis not present

## 2020-10-02 DIAGNOSIS — N5201 Erectile dysfunction due to arterial insufficiency: Secondary | ICD-10-CM | POA: Diagnosis not present

## 2020-10-02 DIAGNOSIS — R972 Elevated prostate specific antigen [PSA]: Secondary | ICD-10-CM | POA: Diagnosis not present

## 2020-10-02 DIAGNOSIS — N401 Enlarged prostate with lower urinary tract symptoms: Secondary | ICD-10-CM | POA: Diagnosis not present

## 2020-10-02 DIAGNOSIS — R3912 Poor urinary stream: Secondary | ICD-10-CM | POA: Diagnosis not present

## 2020-10-02 DIAGNOSIS — R8271 Bacteriuria: Secondary | ICD-10-CM | POA: Diagnosis not present

## 2020-10-22 DIAGNOSIS — N5201 Erectile dysfunction due to arterial insufficiency: Secondary | ICD-10-CM | POA: Diagnosis not present

## 2020-10-22 DIAGNOSIS — R3912 Poor urinary stream: Secondary | ICD-10-CM | POA: Diagnosis not present

## 2020-10-22 DIAGNOSIS — N401 Enlarged prostate with lower urinary tract symptoms: Secondary | ICD-10-CM | POA: Diagnosis not present

## 2020-11-15 DIAGNOSIS — Z23 Encounter for immunization: Secondary | ICD-10-CM | POA: Diagnosis not present

## 2020-11-28 DIAGNOSIS — J00 Acute nasopharyngitis [common cold]: Secondary | ICD-10-CM | POA: Diagnosis not present

## 2020-12-05 DIAGNOSIS — H524 Presbyopia: Secondary | ICD-10-CM | POA: Diagnosis not present

## 2020-12-05 DIAGNOSIS — H35373 Puckering of macula, bilateral: Secondary | ICD-10-CM | POA: Diagnosis not present

## 2020-12-05 DIAGNOSIS — H04123 Dry eye syndrome of bilateral lacrimal glands: Secondary | ICD-10-CM | POA: Diagnosis not present

## 2020-12-05 DIAGNOSIS — H40013 Open angle with borderline findings, low risk, bilateral: Secondary | ICD-10-CM | POA: Diagnosis not present

## 2020-12-05 DIAGNOSIS — H26492 Other secondary cataract, left eye: Secondary | ICD-10-CM | POA: Diagnosis not present

## 2021-03-28 DIAGNOSIS — R972 Elevated prostate specific antigen [PSA]: Secondary | ICD-10-CM | POA: Diagnosis not present

## 2021-04-04 DIAGNOSIS — R972 Elevated prostate specific antigen [PSA]: Secondary | ICD-10-CM | POA: Diagnosis not present

## 2021-04-04 DIAGNOSIS — N401 Enlarged prostate with lower urinary tract symptoms: Secondary | ICD-10-CM | POA: Diagnosis not present

## 2021-04-04 DIAGNOSIS — N5201 Erectile dysfunction due to arterial insufficiency: Secondary | ICD-10-CM | POA: Diagnosis not present

## 2021-04-04 DIAGNOSIS — R351 Nocturia: Secondary | ICD-10-CM | POA: Diagnosis not present

## 2021-04-04 DIAGNOSIS — R3912 Poor urinary stream: Secondary | ICD-10-CM | POA: Diagnosis not present

## 2021-10-09 DIAGNOSIS — E785 Hyperlipidemia, unspecified: Secondary | ICD-10-CM | POA: Diagnosis not present

## 2021-10-09 DIAGNOSIS — Z125 Encounter for screening for malignant neoplasm of prostate: Secondary | ICD-10-CM | POA: Diagnosis not present

## 2021-10-09 DIAGNOSIS — I700 Atherosclerosis of aorta: Secondary | ICD-10-CM | POA: Diagnosis not present

## 2021-10-09 DIAGNOSIS — R7989 Other specified abnormal findings of blood chemistry: Secondary | ICD-10-CM | POA: Diagnosis not present

## 2021-10-16 DIAGNOSIS — I2510 Atherosclerotic heart disease of native coronary artery without angina pectoris: Secondary | ICD-10-CM | POA: Diagnosis not present

## 2021-10-16 DIAGNOSIS — K219 Gastro-esophageal reflux disease without esophagitis: Secondary | ICD-10-CM | POA: Diagnosis not present

## 2021-10-16 DIAGNOSIS — R972 Elevated prostate specific antigen [PSA]: Secondary | ICD-10-CM | POA: Diagnosis not present

## 2021-10-16 DIAGNOSIS — I700 Atherosclerosis of aorta: Secondary | ICD-10-CM | POA: Diagnosis not present

## 2021-10-16 DIAGNOSIS — E785 Hyperlipidemia, unspecified: Secondary | ICD-10-CM | POA: Diagnosis not present

## 2021-10-16 DIAGNOSIS — M5136 Other intervertebral disc degeneration, lumbar region: Secondary | ICD-10-CM | POA: Diagnosis not present

## 2021-10-16 DIAGNOSIS — E669 Obesity, unspecified: Secondary | ICD-10-CM | POA: Diagnosis not present

## 2021-10-16 DIAGNOSIS — Z1331 Encounter for screening for depression: Secondary | ICD-10-CM | POA: Diagnosis not present

## 2021-10-16 DIAGNOSIS — M19041 Primary osteoarthritis, right hand: Secondary | ICD-10-CM | POA: Diagnosis not present

## 2021-10-16 DIAGNOSIS — R82998 Other abnormal findings in urine: Secondary | ICD-10-CM | POA: Diagnosis not present

## 2021-10-16 DIAGNOSIS — N1831 Chronic kidney disease, stage 3a: Secondary | ICD-10-CM | POA: Diagnosis not present

## 2021-10-16 DIAGNOSIS — Z1389 Encounter for screening for other disorder: Secondary | ICD-10-CM | POA: Diagnosis not present

## 2021-10-16 DIAGNOSIS — Z0000 Encounter for general adult medical examination without abnormal findings: Secondary | ICD-10-CM | POA: Diagnosis not present

## 2021-10-23 DIAGNOSIS — R972 Elevated prostate specific antigen [PSA]: Secondary | ICD-10-CM | POA: Diagnosis not present

## 2021-10-23 DIAGNOSIS — R351 Nocturia: Secondary | ICD-10-CM | POA: Diagnosis not present

## 2021-10-23 DIAGNOSIS — N401 Enlarged prostate with lower urinary tract symptoms: Secondary | ICD-10-CM | POA: Diagnosis not present

## 2021-12-12 DIAGNOSIS — H26492 Other secondary cataract, left eye: Secondary | ICD-10-CM | POA: Diagnosis not present

## 2021-12-12 DIAGNOSIS — H40013 Open angle with borderline findings, low risk, bilateral: Secondary | ICD-10-CM | POA: Diagnosis not present

## 2021-12-12 DIAGNOSIS — H04123 Dry eye syndrome of bilateral lacrimal glands: Secondary | ICD-10-CM | POA: Diagnosis not present

## 2021-12-12 DIAGNOSIS — Z961 Presence of intraocular lens: Secondary | ICD-10-CM | POA: Diagnosis not present

## 2021-12-12 DIAGNOSIS — H524 Presbyopia: Secondary | ICD-10-CM | POA: Diagnosis not present

## 2022-01-27 DIAGNOSIS — R972 Elevated prostate specific antigen [PSA]: Secondary | ICD-10-CM | POA: Diagnosis not present

## 2022-04-08 DIAGNOSIS — R972 Elevated prostate specific antigen [PSA]: Secondary | ICD-10-CM | POA: Diagnosis not present

## 2022-04-14 DIAGNOSIS — C61 Malignant neoplasm of prostate: Secondary | ICD-10-CM | POA: Diagnosis not present

## 2022-04-16 ENCOUNTER — Other Ambulatory Visit (HOSPITAL_COMMUNITY): Payer: Self-pay | Admitting: Urology

## 2022-04-16 DIAGNOSIS — C61 Malignant neoplasm of prostate: Secondary | ICD-10-CM | POA: Diagnosis not present

## 2022-04-16 DIAGNOSIS — N281 Cyst of kidney, acquired: Secondary | ICD-10-CM | POA: Diagnosis not present

## 2022-04-22 ENCOUNTER — Encounter (HOSPITAL_COMMUNITY)
Admission: RE | Admit: 2022-04-22 | Discharge: 2022-04-22 | Disposition: A | Discharge status: Home / Self Care | Payer: Medicare HMO | Source: Ambulatory Visit | Attending: Urology | Admitting: Urology

## 2022-04-22 DIAGNOSIS — C61 Malignant neoplasm of prostate: Secondary | ICD-10-CM | POA: Insufficient documentation

## 2022-04-22 MED ORDER — TECHNETIUM TC 99M MEDRONATE IV KIT
21.0200 | PACK | Freq: Once | INTRAVENOUS | Status: AC
  Administered 2022-04-22: 21.02 via INTRAVENOUS

## 2022-04-27 DIAGNOSIS — R3912 Poor urinary stream: Secondary | ICD-10-CM | POA: Diagnosis not present

## 2022-04-27 DIAGNOSIS — C775 Secondary and unspecified malignant neoplasm of intrapelvic lymph nodes: Secondary | ICD-10-CM | POA: Diagnosis not present

## 2022-04-27 DIAGNOSIS — C61 Malignant neoplasm of prostate: Secondary | ICD-10-CM | POA: Diagnosis not present

## 2022-04-27 DIAGNOSIS — N403 Nodular prostate with lower urinary tract symptoms: Secondary | ICD-10-CM | POA: Diagnosis not present

## 2022-05-05 ENCOUNTER — Other Ambulatory Visit: Payer: Self-pay | Admitting: Radiation Oncology

## 2022-05-05 ENCOUNTER — Encounter: Payer: Self-pay | Admitting: Radiation Oncology

## 2022-05-05 ENCOUNTER — Inpatient Hospital Stay
Admission: RE | Admit: 2022-05-05 | Discharge: 2022-05-05 | Disposition: A | Discharge status: Home / Self Care | Payer: Self-pay | Source: Ambulatory Visit | Attending: Radiation Oncology | Admitting: Radiation Oncology

## 2022-05-05 DIAGNOSIS — C61 Malignant neoplasm of prostate: Secondary | ICD-10-CM

## 2022-05-05 DIAGNOSIS — N403 Nodular prostate with lower urinary tract symptoms: Secondary | ICD-10-CM

## 2022-05-05 DIAGNOSIS — C775 Secondary and unspecified malignant neoplasm of intrapelvic lymph nodes: Secondary | ICD-10-CM

## 2022-05-05 DIAGNOSIS — R3912 Poor urinary stream: Secondary | ICD-10-CM

## 2022-05-13 NOTE — Progress Notes (Signed)
GU Location of Tumor / Histology: Prostate Ca  If Prostate Cancer, Gleason Score is (4 + 5) and PSA is (33.2 on 01/27/2022)  Biopsies     04/22/2022 Dr. Bjorn Pippin NM Bone Scan Whole Body CLINICAL DATA: Prostate carcinoma   IMPRESSION: No scintigraphic evidence of osseous metastatic disease.  04/16/2022 Dr. Bjorn Pippin CT Abdomen and Pelvis with Contrast Clinical Data:  Prostate cancer.  Staging.  *Tracking code: BO*.    Past/Anticipated interventions by urology, if any:     Past/Anticipated interventions by medical oncology, if any: NA  Weight changes, if any:  Yes, 10 pounds.  IPSS:  18 SHIM: 9  Bowel/Bladder complaints, if any:  No  Nausea/Vomiting, if any:  No  Pain issues, if any:  0/10  SAFETY ISSUES: Prior radiation? No Pacemaker/ICD? No Possible current pregnancy?  Male Is the patient on methotrexate?  No  Current Complaints / other details:  Need more information about treatment options.

## 2022-05-17 NOTE — Progress Notes (Signed)
Radiation Oncology         [phone] ________________________________  Initial Outpatient Consultation  Name: Brandon Gomez MRN: [id number]  Date: 05/18/2022  DOB: 02/07/1934  VB:TYOMAYOK, Barry Dienes, MD  Bjorn Pippin, MD   REFERRING PHYSICIAN: Bjorn Pippin, MD  DIAGNOSIS: 89 y.o. gentleman with Stage T2bN1 adenocarcinoma of the prostate with Gleason score of 4+5, and PSA of 35.1.  No diagnosis found.  HISTORY OF PRESENT ILLNESS: Brandon Gomez is a 89 y.o. male with a diagnosis of prostate cancer. He was initially seen by Dr. Annabell Howells in 2018 for microhematuria and BPH. He was referred back in 2022 for worsening LUTS. He was also noted to have a rise in PSA to 4.368, but subsequent PSA in 03/2021 showed a drop to 3.87. On recent routine labs with his PCP, Dr. Eloise Harman, he was noted to have a significantly elevated PSA of 50. Accordingly, he was sent back to [facility] and saw Bartholomew Crews, NP on 10/23/21. A repeat PSA obtained that day showed a drop but continued elevation to 15.7, but a repeat in 01/2022 showed a further rise to 33.2.  The patient proceeded to transrectal ultrasound with 12 biopsies of the prostate on 04/08/22.  The prostate volume measured 90 cc.  Out of 12 core biopsies, 11 were positive.  The maximum Gleason score was 4+5, and this was seen in left mid lateral, left base, left mid, left apex, right base (with perineural invasion), right mid (with PNI), right base lateral (with PNI and extraprostatic extension), and right mid lateral. Additionally, Gleason 4+4 was seen in right apex lateral (small focus) and left base lateral, and Gleason 3+4 in right apex.  He underwent staging CT A/P on 04/16/22 showing: enlarged bilateral pelvic sidewall lymph nodes; prostate gland enlargement; mild diffuse circumferential wall thickening involving urinary bladder; similar <1 cm liver lesions, which likely represent small cysts. He also underwent staging bone scan on 04/22/22 showing no  evidence of osseous metastatic disease. A repeat PSA obtained on 04/28/22 was only slightly higher at 35.1. {Has he started ADT yet? Maybe this week?}  The patient reviewed the biopsy results with his urologist and he has kindly been referred today for discussion of potential radiation treatment options.   PREVIOUS RADIATION THERAPY: No  PAST MEDICAL HISTORY:  Past Medical History:  Diagnosis Date   Bradycardia    Carpal tunnel syndrome    Hyperlipidemia    Palpitations       PAST SURGICAL HISTORY: Past Surgical History:  Procedure Laterality Date   TRANSTHORACIC ECHOCARDIOGRAM  07/18/2008, 2020   EF-55-60%/no valvular abnormality/mitral inflow and tissue doppler consistent with impaired LV relaxation    FAMILY HISTORY:  Family History  Problem Relation Age of Onset   Diabetes Father    Heart attack Father    Hypertension Father    Heart attack Mother    Diabetes Mother    Heart attack Brother    Heart disease Sister     SOCIAL HISTORY:  Social History   Socioeconomic History   Marital status: Married    Spouse name: Maureen Ralphs   Number of children: 1   Years of education: Not on file   Highest education level: Not on file  Occupational History   Occupation: Retired    Comment: Patent attorney for [employer]    Comment: Diplomatic Services operational officer  Tobacco Use   Smoking status: Never   Smokeless tobacco: Never  Vaping Use   Vaping Use: Never used  Substance and Sexual Activity  Alcohol use: No    Alcohol/week: 0.0 standard drinks of alcohol   Drug use: No   Sexual activity: Not on file  Other Topics Concern   Not on file  Social History Narrative   Lives with wife   Social Determinants of Health   Financial Resource Strain: Not on file  Food Insecurity: Not on file  Transportation Needs: Not on file  Physical Activity: Not on file  Stress: Not on file  Social Connections: Not on file  Intimate Partner Violence: Not on file    ALLERGIES: Patient has  no known allergies.  MEDICATIONS:  Current Outpatient Medications  Medication Sig Dispense Refill   aspirin 81 MG tablet Take 81 mg by mouth daily.     Cholecalciferol (VITAMIN D) 2000 UNITS CAPS Take 1 capsule by mouth.     diclofenac Sodium (VOLTAREN) 1 % GEL Apply 2 g topically 4 (four) times daily. 150 g 0   lovastatin (MEVACOR) 40 MG tablet Take 20 mg by mouth at bedtime.  0   meloxicam (MOBIC) 7.5 MG tablet Take 1 tablet (7.5 mg total) by mouth daily as needed for up to 14 doses for pain. (Patient not taking: Reported on 01/02/2020) 30 tablet 2   methocarbamol (ROBAXIN) 500 MG tablet Take 1/2-1 tab once daily at night for muscle spasms (Patient not taking: Reported on 01/02/2020) 10 tablet 0   metoprolol tartrate (LOPRESSOR) 25 MG tablet Take 0.5 tablets (12.5 mg total) by mouth 2 (two) times daily as needed. 60 tablet 3   Multiple Vitamin (MULTIVITAMIN) tablet Take 1 tablet by mouth daily.     neomycin-polymyxin-hydrocortisone (CORTISPORIN) 3.5-10000-1 otic suspension Place 1 drop into both ears daily.  (Patient not taking: Reported on 01/02/2020)  1   Omega-3 Fatty Acids (OMEGA 3 PO) Take 2 packets by mouth daily.      vitamin B-12 (CYANOCOBALAMIN) 250 MCG tablet Take 250 mcg by mouth daily.     No current facility-administered medications for this encounter.    REVIEW OF SYSTEMS:  On review of systems, the patient reports that he is doing well overall. He denies any chest pain, shortness of breath, cough, fevers, chills, night sweats, unintended weight changes. He denies any bowel disturbances, and denies abdominal pain, nausea or vomiting. He denies any new musculoskeletal or joint aches or pains. His IPSS was ***, indicating *** urinary symptoms. His SHIM was ***, indicating he {does not have/has mild/moderate/severe} erectile dysfunction. A complete review of systems is obtained and is otherwise negative.    PHYSICAL EXAM:  Wt Readings from Last 3 Encounters:  01/02/20 195 lb  (88.5 kg)  10/03/19 188 lb (85.3 kg)  05/17/18 193 lb (87.5 kg)   Temp Readings from Last 3 Encounters:  No data found for Temp   BP Readings from Last 3 Encounters:  01/02/20 139/73  10/03/19 130/70  05/17/18 (!) 116/56   Pulse Readings from Last 3 Encounters:  01/02/20 (!) 50  10/03/19 65  05/17/18 (!) 56    /10  In general this is a well appearing *** male in no acute distress. He's alert and oriented x4 and appropriate throughout the examination. Cardiopulmonary assessment is negative for acute distress, and he exhibits normal effort.     KPS = ***  100 - Normal; no complaints; no evidence of disease. 90   - Able to carry on normal activity; minor signs or symptoms of disease. 80   - Normal activity with effort; some signs or symptoms of disease. 70   -  Cares for self; unable to carry on normal activity or to do active work. 60   - Requires occasional assistance, but is able to care for most of his personal needs. 50   - Requires considerable assistance and frequent medical care. 40   - Disabled; requires special care and assistance. 30   - Severely disabled; hospital admission is indicated although death not imminent. 20   - Very sick; hospital admission necessary; active supportive treatment necessary. 10   - Moribund; fatal processes progressing rapidly. 0     - Dead  Karnofsky DA, Abelmann WH, Craver LS and Burchenal [facility] [phone]) The use of the nitrogen mustards in the palliative treatment of carcinoma: with particular reference to bronchogenic carcinoma Cancer 1 634-56  LABORATORY DATA:  Lab Results  Component Value Date   WBC 5.3 07/06/2008   HGB 14.6 07/06/2008   HCT 43.3 07/06/2008   MCV 92.0 07/06/2008   PLT 169 07/06/2008   Lab Results  Component Value Date   NA 142 07/06/2008   K 3.9 07/06/2008   CL 105 07/06/2008   CO2 25 07/06/2008   No results found for: "ALT", "AST", "GGT", "ALKPHOS", "BILITOT"   RADIOGRAPHY: NM Bone Scan Whole Body  Result  Date: 04/25/2022 CLINICAL DATA:  Prostate carcinoma EXAM: NUCLEAR MEDICINE WHOLE BODY BONE SCAN TECHNIQUE: Whole body anterior and posterior images were obtained approximately 3 hours after intravenous injection of radiopharmaceutical. RADIOPHARMACEUTICALS:  21.0 mCi Technetium-82m MDP IV COMPARISON:  None Available. FINDINGS: Foci of mildly increased activity in sternoclavicular joints and left wrist, characteristic of degenerative change. Otherwise physiologic distribution of radiopharmaceutical. IMPRESSION: No scintigraphic evidence of osseous metastatic disease. Electronically Signed   By: Corlis Leak M.D.   On: 04/25/2022 09:49      IMPRESSION/PLAN: 1. 89 y.o. gentleman with Stage T2bN1 adenocarcinoma of the prostate with Gleason score of 4+5, and PSA of 35.1. We discussed the patient's workup and outlined the nature of prostate cancer in this setting. Accordingly, he is eligible for a variety of potential treatment options including LT-ADT in combination with 8 weeks of external radiation, 5 weeks of external radiation with an upfront brachytherapy boost, or prostatectomy. We discussed the available radiation techniques, and focused on the details and logistics of delivery. The patient may not be an ideal candidate for brachytherapy boost with a prostate volume of 90 cc, as well as his level of LUTS despite tamsulosin. We discussed and outlined the risks, benefits, short and long-term effects associated with radiotherapy and compared and contrasted these with prostatectomy. We discussed the role of SpaceOAR gel in reducing the rectal toxicity associated with radiotherapy. We also detailed the role of ADT in the treatment of high risk prostate cancer and outlined the associated side effects that could be expected with this therapy. He appears to have a good understanding of his disease and our treatment recommendations which are of curative intent.  He was encouraged to ask questions that were answered to  his stated satisfaction.  At the conclusion of our conversation, the patient is interested in moving forward with ***.  We personally spent *** minutes in this encounter including chart review, reviewing radiological studies, meeting face-to-face with the patient, entering orders and completing documentation.     Joyice Faster, PA-C    Margaretmary Dys, MD  [facility]  Radiation Oncology Direct Dial: [phone]  Fax: [phone] [email or web address]  Skype  LinkedIn   This document serves as a record of services personally performed by Margaretmary Dys, MD and  Joyice Faster, PA-C. It was created on their behalf by Mickie Bail, a trained medical scribe. The creation of this record is based on the scribe's personal observations and the provider's statements to them. This document has been checked and approved by the attending provider.

## 2022-05-18 ENCOUNTER — Ambulatory Visit
Admission: RE | Admit: 2022-05-18 | Discharge: 2022-05-18 | Disposition: A | Discharge status: Home / Self Care | Payer: Medicare HMO | Source: Ambulatory Visit | Attending: Radiation Oncology | Admitting: Radiation Oncology

## 2022-05-18 ENCOUNTER — Encounter: Payer: Self-pay | Admitting: Radiation Oncology

## 2022-05-18 ENCOUNTER — Telehealth: Payer: Self-pay | Admitting: *Deleted

## 2022-05-18 VITALS — BP 139/84 | HR 69 | Temp 97.1°F | Resp 18 | Ht 67.0 in | Wt 187.2 lb

## 2022-05-18 DIAGNOSIS — C61 Malignant neoplasm of prostate: Secondary | ICD-10-CM | POA: Insufficient documentation

## 2022-05-18 DIAGNOSIS — E785 Hyperlipidemia, unspecified: Secondary | ICD-10-CM | POA: Insufficient documentation

## 2022-05-18 DIAGNOSIS — Z7982 Long term (current) use of aspirin: Secondary | ICD-10-CM | POA: Insufficient documentation

## 2022-05-18 DIAGNOSIS — Z191 Hormone sensitive malignancy status: Secondary | ICD-10-CM | POA: Diagnosis not present

## 2022-05-18 DIAGNOSIS — Z79899 Other long term (current) drug therapy: Secondary | ICD-10-CM | POA: Insufficient documentation

## 2022-05-18 NOTE — Telephone Encounter (Signed)
Called patient to inform of ADT appt.for 05-25-22- arrival time- 7:45 am @ Dr. Belva Crome office, spoke with patient's wife-  Maureen Ralphs and she is aware of this appt.

## 2022-05-18 NOTE — Progress Notes (Signed)
Introduced myself to the patient, and his wife, as the prostate nurse navigator.  No barriers to care identified at this time.  He is here to discuss his radiation treatment options. Patient started Orgovyx on 3/19. I gave him my business card and asked him to call me with questions or concerns.  Verbalized understanding.

## 2022-05-25 DIAGNOSIS — C61 Malignant neoplasm of prostate: Secondary | ICD-10-CM | POA: Diagnosis not present

## 2022-06-02 ENCOUNTER — Other Ambulatory Visit: Payer: Self-pay | Admitting: Urology

## 2022-06-11 DIAGNOSIS — R3912 Poor urinary stream: Secondary | ICD-10-CM | POA: Diagnosis not present

## 2022-06-11 DIAGNOSIS — C61 Malignant neoplasm of prostate: Secondary | ICD-10-CM | POA: Diagnosis not present

## 2022-06-11 DIAGNOSIS — C775 Secondary and unspecified malignant neoplasm of intrapelvic lymph nodes: Secondary | ICD-10-CM | POA: Diagnosis not present

## 2022-06-11 NOTE — Progress Notes (Signed)
Patient with Rad Onc consult on 4/8 for his stage T2bN1 adenocarcinoma of the prostate with Gleason score of 4+5, and PSA of 35.1.   Patient originally decided to proceed with ADT followed by 8 weeks of external radiation.    MD's notified that patient will now proceed with ADT alone.

## 2022-08-06 ENCOUNTER — Encounter (HOSPITAL_BASED_OUTPATIENT_CLINIC_OR_DEPARTMENT_OTHER): Payer: Self-pay

## 2022-08-06 ENCOUNTER — Ambulatory Visit (HOSPITAL_BASED_OUTPATIENT_CLINIC_OR_DEPARTMENT_OTHER): Admit: 2022-08-06 | Payer: Medicare HMO | Admitting: Urology

## 2022-08-06 SURGERY — INSERTION, GOLD SEEDS
Anesthesia: Monitor Anesthesia Care

## 2022-08-14 ENCOUNTER — Ambulatory Visit: Payer: Medicare HMO | Admitting: Radiation Oncology

## 2022-09-17 DIAGNOSIS — C775 Secondary and unspecified malignant neoplasm of intrapelvic lymph nodes: Secondary | ICD-10-CM | POA: Diagnosis not present

## 2022-09-17 DIAGNOSIS — R3912 Poor urinary stream: Secondary | ICD-10-CM | POA: Diagnosis not present

## 2022-09-17 DIAGNOSIS — C61 Malignant neoplasm of prostate: Secondary | ICD-10-CM | POA: Diagnosis not present

## 2022-09-17 DIAGNOSIS — N5201 Erectile dysfunction due to arterial insufficiency: Secondary | ICD-10-CM | POA: Diagnosis not present

## 2022-10-05 NOTE — Progress Notes (Unsigned)
Cardiology Office Note:   Date:  10/06/2022  ID:  Brandon Gomez, DOB 1933-05-14, MRN [id number] PCP: Garlan Fillers, MD  [facility] Providers Cardiologist:  Lance Muss, MD    History of Present Illness:   Brandon Gomez is a 89 y.o. male with history of hypertension, PACs, prostate cancer, aortic atherosclerosis. Patient presents for routine follow up.   Patient was last seen over 3 years ago by Dr. Eldridge Dace. At that time, he was doing well without symptoms from PACs. Since then, he was found with elevated PSA and began following with [facility]. Because of a rapid rise in PSA, there were tentative plans for radiation but now this has been deferred with improvement in labs and patient now on Eligard injections.   He continues to be very active in his yard, mowing, tree trimming, etc. Denies any shortness of breath or chest pain with these activities. He also denies lower extremity edema, fatigue, palpitations, melena, hematuria, hemoptysis, diaphoresis, weakness, presyncope, syncope, orthopnea, and PND.   Patient still does not drink alcohol and tries to follow a heart healthy diet, avoids high fat foods.   He reports that he had not been taking ASA on a regular basis. Also endorses taking his Lovastatin in spurts, forgets to take on occasion.   He continues to monitor BP at home, typical readings are in the 120s/70s.  Studies Reviewed:    EKG:   EKG Interpretation Date/Time:  Tuesday October 06 2022 10:36:26 EDT Ventricular Rate:  71 PR Interval:  242 QRS Duration:  102 QT Interval:  414 QTC Calculation: 449 R Axis:   -7  Text Interpretation: Sinus rhythm First degree heart block When compared with ECG of 06-Jul-2008 15:29, Premature ventricular complexes are no longer Present PR interval has increased Confirmed by Perlie Gold [phone]) on 10/06/2022 10:44:33 AM     Risk Assessment/Calculations:              Physical Exam:   VS:  BP 122/70  (BP Location: Left Arm, Patient Position: Sitting)   Pulse 71   Ht 5\' 7"  (1.702 m)   Wt 190 lb 9.6 oz (86.5 kg)   SpO2 96%   BMI 29.85 kg/m    Wt Readings from Last 3 Encounters:  10/06/22 190 lb 9.6 oz (86.5 kg)  05/18/22 187 lb 4 oz (84.9 kg)  01/02/20 195 lb (88.5 kg)     Physical Exam Vitals reviewed.  Constitutional:      Appearance: Normal appearance.  HENT:     Head: Normocephalic.  Eyes:     Pupils: Pupils are equal, round, and reactive to light.  Cardiovascular:     Rate and Rhythm: Normal rate and regular rhythm.     Pulses: Normal pulses.     Heart sounds: Normal heart sounds.  Pulmonary:     Effort: Pulmonary effort is normal.     Breath sounds: Normal breath sounds.  Abdominal:     General: Abdomen is flat.     Palpations: Abdomen is soft.  Musculoskeletal:     Right lower leg: No edema.     Left lower leg: No edema.  Skin:    General: Skin is warm and dry.     Capillary Refill: Capillary refill takes less than 2 seconds.  Neurological:     General: No focal deficit present.     Mental Status: He is alert and oriented to person, place, and time.  Psychiatric:        Mood  and Affect: Mood normal.        Behavior: Behavior normal.        Thought Content: Thought content normal.        Judgment: Judgment normal.      ASSESSMENT AND PLAN:   PVC (premature ventricular contraction) Patient with history of PACs and PVCs on ECG but without symptoms. Continues to deny any awareness of palpitations. ECG today with stable first degree AVB and no observed ectopic beats. Continue to monitor.   Elevated blood pressure reading without diagnosis of hypertension Patient with stable BP in clinic today 110/66 and 122/70. Continue to monitor. Recent labs from outside provider reviewed and show stable creatinine 0.9, eGFR 77.1  Hyperlipidemia Last LDL 78 as of 10/09/21. Patient admits to inconsistent compliance with Lovastatin. Will check LDL direct today and adjust  dose per results. Encouraged him to take Lovastatin as prescribed.   Coronary artery calcification seen on CT scan 2019 CT chest with atheromatous calcifications, including the coronary arteries and aorta. ASA listed in patient's med list but he reports only taking occasionally. Per current guidelines, with overall low CAD risk and mild disease, ASA not indicated. Continue with statin.            Signed, Perlie Gold, PA-C

## 2022-10-06 ENCOUNTER — Encounter: Payer: Self-pay | Admitting: Cardiology

## 2022-10-06 ENCOUNTER — Ambulatory Visit: Payer: Medicare HMO | Attending: Cardiology | Admitting: Cardiology

## 2022-10-06 VITALS — BP 122/70 | HR 71 | Ht 67.0 in | Wt 190.6 lb

## 2022-10-06 DIAGNOSIS — I493 Ventricular premature depolarization: Secondary | ICD-10-CM | POA: Diagnosis not present

## 2022-10-06 DIAGNOSIS — E782 Mixed hyperlipidemia: Secondary | ICD-10-CM

## 2022-10-06 DIAGNOSIS — E785 Hyperlipidemia, unspecified: Secondary | ICD-10-CM | POA: Insufficient documentation

## 2022-10-06 DIAGNOSIS — I2510 Atherosclerotic heart disease of native coronary artery without angina pectoris: Secondary | ICD-10-CM | POA: Diagnosis not present

## 2022-10-06 DIAGNOSIS — R030 Elevated blood-pressure reading, without diagnosis of hypertension: Secondary | ICD-10-CM | POA: Diagnosis not present

## 2022-10-06 NOTE — Patient Instructions (Signed)
Medication Instructions:  Your physician recommends that you continue on your current medications as directed. Please refer to the Current Medication list given to you today.  *If you need a refill on your cardiac medications before your next appointment, please call your pharmacy*   Lab Work: LDL Direct  If you have labs (blood work) drawn today and your tests are completely normal, you will receive your results only by: MyChart Message (if you have MyChart) OR A paper copy in the mail If you have any lab test that is abnormal or we need to change your treatment, we will call you to review the results.   Testing/Procedures: NONE   Follow-Up: At [facility], you and your health needs are our priority.  As part of our continuing mission to provide you with exceptional heart care, we have created designated Provider Care Teams.  These Care Teams include your primary Cardiologist (physician) and Advanced Practice Providers (APPs -  Physician Assistants and Nurse Practitioners) who all work together to provide you with the care you need, when you need it.   Your next appointment:   1 year(s)  Provider:   Perlie Gold, PA-C

## 2022-10-06 NOTE — Assessment & Plan Note (Signed)
2019 CT chest with atheromatous calcifications, including the coronary arteries and aorta. ASA listed in patient's med list but he reports only taking occasionally. Per current guidelines, with overall low CAD risk and mild disease, ASA not indicated. Continue with statin.

## 2022-10-06 NOTE — Assessment & Plan Note (Signed)
Patient with stable BP in clinic today 110/66 and 122/70. Continue to monitor. Recent labs from outside provider reviewed and show stable creatinine 0.9, eGFR 77.1

## 2022-10-06 NOTE — Assessment & Plan Note (Signed)
Patient with history of PACs and PVCs on ECG but without symptoms. Continues to deny any awareness of palpitations. ECG today with stable first degree AVB and no observed ectopic beats. Continue to monitor.

## 2022-10-06 NOTE — Assessment & Plan Note (Signed)
Last LDL 78 as of 10/09/21. Patient admits to inconsistent compliance with Lovastatin. Will check LDL direct today and adjust dose per results. Encouraged him to take Lovastatin as prescribed.

## 2022-10-07 ENCOUNTER — Telehealth: Payer: Self-pay | Admitting: Cardiology

## 2022-10-07 LAB — LDL CHOLESTEROL, DIRECT: LDL Direct: 138 mg/dL — ABNORMAL HIGH (ref 0–99)

## 2022-10-07 NOTE — Telephone Encounter (Signed)
  Pt is returning call to get lab result. He said to call him at [phone]

## 2022-10-07 NOTE — Telephone Encounter (Signed)
Pt advised his lab results and he says he will take his Statin daily and will have DR Eloise Harman recheck at his physical in October and send results to [facility] for his review.

## 2022-11-17 DIAGNOSIS — H903 Sensorineural hearing loss, bilateral: Secondary | ICD-10-CM | POA: Diagnosis not present

## 2022-11-17 DIAGNOSIS — H9113 Presbycusis, bilateral: Secondary | ICD-10-CM | POA: Diagnosis not present

## 2022-12-24 DIAGNOSIS — C775 Secondary and unspecified malignant neoplasm of intrapelvic lymph nodes: Secondary | ICD-10-CM | POA: Diagnosis not present

## 2022-12-24 DIAGNOSIS — I2510 Atherosclerotic heart disease of native coronary artery without angina pectoris: Secondary | ICD-10-CM | POA: Diagnosis not present

## 2022-12-24 DIAGNOSIS — R3912 Poor urinary stream: Secondary | ICD-10-CM | POA: Diagnosis not present

## 2022-12-24 DIAGNOSIS — Z79899 Other long term (current) drug therapy: Secondary | ICD-10-CM | POA: Diagnosis not present

## 2022-12-24 DIAGNOSIS — E785 Hyperlipidemia, unspecified: Secondary | ICD-10-CM | POA: Diagnosis not present

## 2022-12-24 DIAGNOSIS — N1831 Chronic kidney disease, stage 3a: Secondary | ICD-10-CM | POA: Diagnosis not present

## 2022-12-24 DIAGNOSIS — R972 Elevated prostate specific antigen [PSA]: Secondary | ICD-10-CM | POA: Diagnosis not present

## 2022-12-24 DIAGNOSIS — C61 Malignant neoplasm of prostate: Secondary | ICD-10-CM | POA: Diagnosis not present

## 2022-12-31 DIAGNOSIS — M545 Low back pain, unspecified: Secondary | ICD-10-CM | POA: Diagnosis not present

## 2022-12-31 DIAGNOSIS — I2510 Atherosclerotic heart disease of native coronary artery without angina pectoris: Secondary | ICD-10-CM | POA: Diagnosis not present

## 2022-12-31 DIAGNOSIS — E785 Hyperlipidemia, unspecified: Secondary | ICD-10-CM | POA: Diagnosis not present

## 2022-12-31 DIAGNOSIS — Z1331 Encounter for screening for depression: Secondary | ICD-10-CM | POA: Diagnosis not present

## 2022-12-31 DIAGNOSIS — C61 Malignant neoplasm of prostate: Secondary | ICD-10-CM | POA: Diagnosis not present

## 2022-12-31 DIAGNOSIS — Z0000 Encounter for general adult medical examination without abnormal findings: Secondary | ICD-10-CM | POA: Diagnosis not present

## 2022-12-31 DIAGNOSIS — Z23 Encounter for immunization: Secondary | ICD-10-CM | POA: Diagnosis not present

## 2022-12-31 DIAGNOSIS — K219 Gastro-esophageal reflux disease without esophagitis: Secondary | ICD-10-CM | POA: Diagnosis not present

## 2022-12-31 DIAGNOSIS — R82998 Other abnormal findings in urine: Secondary | ICD-10-CM | POA: Diagnosis not present

## 2022-12-31 DIAGNOSIS — G8929 Other chronic pain: Secondary | ICD-10-CM | POA: Diagnosis not present

## 2022-12-31 DIAGNOSIS — C775 Secondary and unspecified malignant neoplasm of intrapelvic lymph nodes: Secondary | ICD-10-CM | POA: Diagnosis not present

## 2022-12-31 DIAGNOSIS — M19041 Primary osteoarthritis, right hand: Secondary | ICD-10-CM | POA: Diagnosis not present

## 2022-12-31 DIAGNOSIS — Z1339 Encounter for screening examination for other mental health and behavioral disorders: Secondary | ICD-10-CM | POA: Diagnosis not present

## 2023-04-15 DIAGNOSIS — C61 Malignant neoplasm of prostate: Secondary | ICD-10-CM | POA: Diagnosis not present

## 2023-04-15 DIAGNOSIS — C775 Secondary and unspecified malignant neoplasm of intrapelvic lymph nodes: Secondary | ICD-10-CM | POA: Diagnosis not present

## 2023-04-15 DIAGNOSIS — R3912 Poor urinary stream: Secondary | ICD-10-CM | POA: Diagnosis not present

## 2023-04-15 DIAGNOSIS — N5201 Erectile dysfunction due to arterial insufficiency: Secondary | ICD-10-CM | POA: Diagnosis not present

## 2023-05-26 ENCOUNTER — Encounter (HOSPITAL_BASED_OUTPATIENT_CLINIC_OR_DEPARTMENT_OTHER): Payer: Self-pay | Admitting: Emergency Medicine

## 2023-05-26 ENCOUNTER — Emergency Department (HOSPITAL_BASED_OUTPATIENT_CLINIC_OR_DEPARTMENT_OTHER)

## 2023-05-26 ENCOUNTER — Emergency Department (HOSPITAL_BASED_OUTPATIENT_CLINIC_OR_DEPARTMENT_OTHER)
Admission: EM | Admit: 2023-05-26 | Discharge: 2023-05-26 | Disposition: A | Discharge status: Home / Self Care | Attending: Emergency Medicine | Admitting: Emergency Medicine

## 2023-05-26 ENCOUNTER — Other Ambulatory Visit: Payer: Self-pay

## 2023-05-26 DIAGNOSIS — R002 Palpitations: Secondary | ICD-10-CM | POA: Insufficient documentation

## 2023-05-26 DIAGNOSIS — I10 Essential (primary) hypertension: Secondary | ICD-10-CM | POA: Diagnosis not present

## 2023-05-26 DIAGNOSIS — Z7982 Long term (current) use of aspirin: Secondary | ICD-10-CM | POA: Diagnosis not present

## 2023-05-26 LAB — CBC
HCT: 38.6 % — ABNORMAL LOW (ref 39.0–52.0)
Hemoglobin: 12.9 g/dL — ABNORMAL LOW (ref 13.0–17.0)
MCH: 30.9 pg (ref 26.0–34.0)
MCHC: 33.4 g/dL (ref 30.0–36.0)
MCV: 92.3 fL (ref 80.0–100.0)
Platelets: 177 10*3/uL (ref 150–400)
RBC: 4.18 MIL/uL — ABNORMAL LOW (ref 4.22–5.81)
RDW: 12.8 % (ref 11.5–15.5)
WBC: 6.8 10*3/uL (ref 4.0–10.5)
nRBC: 0 % (ref 0.0–0.2)

## 2023-05-26 LAB — COMPREHENSIVE METABOLIC PANEL WITH GFR
ALT: 19 U/L (ref 0–44)
AST: 25 U/L (ref 15–41)
Albumin: 3.7 g/dL (ref 3.5–5.0)
Alkaline Phosphatase: 69 U/L (ref 38–126)
Anion gap: 11 (ref 5–15)
BUN: 16 mg/dL (ref 8–23)
CO2: 24 mmol/L (ref 22–32)
Calcium: 9.3 mg/dL (ref 8.9–10.3)
Chloride: 104 mmol/L (ref 98–111)
Creatinine, Ser: 1.22 mg/dL (ref 0.61–1.24)
GFR, Estimated: 57 mL/min — ABNORMAL LOW (ref >60)
Glucose, Bld: 148 mg/dL — ABNORMAL HIGH (ref 70–99)
Potassium: 4 mmol/L (ref 3.5–5.1)
Sodium: 139 mmol/L (ref 135–145)
Total Bilirubin: 0.2 mg/dL (ref 0.0–1.2)
Total Protein: 7.4 g/dL (ref 6.5–8.1)

## 2023-05-26 LAB — TROPONIN I (HIGH SENSITIVITY)
Troponin I (High Sensitivity): 7 ng/L (ref <18)
Troponin I (High Sensitivity): 8 ng/L (ref <18)

## 2023-05-26 NOTE — Discharge Instructions (Addendum)
 Ambulatory referral sent to cardiology for evaluation for palpitations and heart rate readings at home of 150.  Today we monitored you for several hours with no cardiac arrhythmias.  No signs of myocardial infarction or heart attack.  Chest x-ray was stable.  Low risk for blood clot.  Safe to return home with follow-up with cardiology.

## 2023-05-26 NOTE — ED Provider Notes (Signed)
 [place] [facility] Provider Note   CSN: [id number] Arrival date & time: 05/26/23  1436     History  Chief Complaint  Patient presents with   Palpitations    Brandon Gomez is a 89 y.o. male.  Patient is an 89 year old male with past medical history of hypertension, bradycardia, palpitations presenting for complaints of palpitations.  Patient states he takes his vitals every morning and had a heart rate of 150.  He states he otherwise felt well.  He states around 1 AM today he was sitting at the table when he began to feel lightheaded and dizzy.  His blood pressure was again back up to 150.  He denies chest pain or shortness of breath.  Admitted to a fluttering sensation in the chest.  He takes daily aspirin but is not on any blood thinners.  No prior history of a flutter, A-fib, or SVT.  He denied nausea, vomiting, diaphoresis.  He states he is otherwise been feeling well with no infectious signs or symptoms.  He denies lower extremity swelling or orthopnea.  Denies unilateral leg swelling, calf pain, history of DVT or PE.  The history is provided by the patient. No language interpreter was used.  Palpitations Associated symptoms: no back pain, no chest pain, no cough, no shortness of breath and no vomiting        Home Medications Prior to Admission medications   Medication Sig Start Date End Date Taking? Authorizing Provider  aspirin 81 MG tablet Take 81 mg by mouth daily.    [provider]  leuprolide , 6 Month, (ELIGARD ) 45 MG injection Inject 45 mg into the skin every 6 (six) months.    [provider]  lovastatin (MEVACOR) 40 MG tablet Take 20 mg by mouth at bedtime. 05/30/14   [provider]  Omega-3 Fatty Acids (OMEGA 3 PO) Take 2 packets by mouth daily.     [provider]  tamsulosin (FLOMAX) 0.4 MG CAPS capsule Take 0.4 mg by mouth daily.    [provider]      Allergies    Patient  has no known allergies.    Review of Systems   Review of Systems  Constitutional:  Negative for chills and fever.  HENT:  Negative for ear pain and sore throat.   Eyes:  Negative for pain and visual disturbance.  Respiratory:  Negative for cough and shortness of breath.   Cardiovascular:  Positive for palpitations. Negative for chest pain.  Gastrointestinal:  Negative for abdominal pain and vomiting.  Genitourinary:  Negative for dysuria and hematuria.  Musculoskeletal:  Negative for arthralgias and back pain.  Skin:  Negative for color change and rash.  Neurological:  Negative for seizures and syncope.  All other systems reviewed and are negative.   Physical Exam Updated Vital Signs BP 126/77 (BP Location: Left Arm)   Pulse 72   Temp 97.9 F (36.6 C) (Oral)   Resp 18   Ht 5\' 7"  (1.702 m)   Wt 83 kg   SpO2 96%   BMI 28.66 kg/m  Physical Exam Vitals and nursing note reviewed.  Constitutional:      General: He is not in acute distress.    Appearance: He is well-developed.  HENT:     Head: Normocephalic and atraumatic.  Eyes:     Conjunctiva/sclera: Conjunctivae normal.  Cardiovascular:     Rate and Rhythm: Normal rate and regular rhythm.     Heart sounds: No  murmur heard. Pulmonary:     Effort: Pulmonary effort is normal. No respiratory distress.     Breath sounds: Normal breath sounds.  Abdominal:     Palpations: Abdomen is soft.     Tenderness: There is no abdominal tenderness.  Musculoskeletal:        General: No swelling.     Cervical back: Neck supple.  Skin:    General: Skin is warm and dry.     Capillary Refill: Capillary refill takes less than 2 seconds.  Neurological:     Mental Status: He is alert.  Psychiatric:        Mood and Affect: Mood normal.     ED Results / Procedures / Treatments   Labs (all labs ordered are listed, but only abnormal results are displayed) Labs Reviewed  CBC - Abnormal; Notable for the following components:      Result  Value   RBC 4.18 (*)    Hemoglobin 12.9 (*)    HCT 38.6 (*)    All other components within normal limits  COMPREHENSIVE METABOLIC PANEL WITH GFR - Abnormal; Notable for the following components:   Glucose, Bld 148 (*)    GFR, Estimated 57 (*)    All other components within normal limits  TROPONIN I (HIGH SENSITIVITY)    EKG None  Radiology No results found.  Procedures Procedures    Medications Ordered in ED Medications - No data to display  ED Course/ Medical Decision Making/ A&P                                 Medical Decision Making Amount and/or Complexity of Data Reviewed Labs: ordered. Radiology: ordered.   Stable ECG, troponin,'s electrolytes.  Stable chest x-ray.  Low risk for PE.  And observed for approximately 2 to 3 hours with no cardiac arrhythmias.  Recommended for follow-up with cardiology if symptoms recur for Holter monitor.  Patient in no distress and overall condition improved here in the ED. Detailed discussions were had with the patient regarding current findings, and need for close f/u with PCP or on call doctor. The patient has been instructed to return immediately if the symptoms worsen in any way for re-evaluation. Patient verbalized understanding and is in agreement with current care plan. All questions answered prior to discharge.        Final Clinical Impression(s) / ED Diagnoses Final diagnoses:  None    Rx / DC Orders ED Discharge Orders     None         Quinn Bucco, DO 05/30/23 1410

## 2023-05-26 NOTE — ED Triage Notes (Signed)
 States had palpitations this am and it was up to 150 now back to 70 no cp or sob , pt aaox 4

## 2023-05-26 NOTE — ED Notes (Addendum)
 Pt alert and oriented X 4 at the time of discharge. RR even and unlabored. No acute distress noted. Pt verbalized understanding of discharge instructions as discussed. Pt ambulatory to lobby at time of discharge. Pt refused wheelchair.

## 2023-06-14 ENCOUNTER — Ambulatory Visit: Attending: Cardiology | Admitting: Cardiology

## 2023-06-14 ENCOUNTER — Ambulatory Visit

## 2023-06-14 ENCOUNTER — Encounter: Payer: Self-pay | Admitting: Cardiology

## 2023-06-14 VITALS — BP 130/74 | HR 64 | Resp 16 | Ht 67.0 in | Wt 188.2 lb

## 2023-06-14 DIAGNOSIS — R9431 Abnormal electrocardiogram [ECG] [EKG]: Secondary | ICD-10-CM

## 2023-06-14 DIAGNOSIS — E782 Mixed hyperlipidemia: Secondary | ICD-10-CM | POA: Diagnosis not present

## 2023-06-14 DIAGNOSIS — R002 Palpitations: Secondary | ICD-10-CM

## 2023-06-14 DIAGNOSIS — I2584 Coronary atherosclerosis due to calcified coronary lesion: Secondary | ICD-10-CM | POA: Diagnosis not present

## 2023-06-14 DIAGNOSIS — I493 Ventricular premature depolarization: Secondary | ICD-10-CM

## 2023-06-14 DIAGNOSIS — I491 Atrial premature depolarization: Secondary | ICD-10-CM

## 2023-06-14 DIAGNOSIS — R030 Elevated blood-pressure reading, without diagnosis of hypertension: Secondary | ICD-10-CM | POA: Diagnosis not present

## 2023-06-14 DIAGNOSIS — I2510 Atherosclerotic heart disease of native coronary artery without angina pectoris: Secondary | ICD-10-CM | POA: Diagnosis not present

## 2023-06-14 NOTE — Patient Instructions (Addendum)
 Medication Instructions:  Your physician recommends that you continue on your current medications as directed. Please refer to the Current Medication list given to you today.  *If you need a refill on your cardiac medications before your next appointment, please call your pharmacy*  Lab Work: None ordered today. If you have labs (blood work) drawn today and your tests are completely normal, you will receive your results only by: MyChart Message (if you have MyChart) OR A paper copy in the mail If you have any lab test that is abnormal or we need to change your treatment, we will call you to review the results.  Testing/Procedures: Your physician has requested that you wear a Zio heart monitor for 14 days. This will be mailed to your home with instructions on how to apply the monitor and how to return it when finished. Please allow 2 weeks after returning the heart monitor before our office calls you with the results.   Your physician has requested that you have an echocardiogram. Echocardiography is a painless test that uses sound waves to create images of your heart. It provides your doctor with information about the size and shape of your heart and how well your heart's chambers and valves are working. This procedure takes approximately one hour. There are no restrictions for this procedure. Please do NOT wear cologne, perfume, aftershave, or lotions (deodorant is allowed). Please arrive 15 minutes prior to your appointment time.  Please note: We ask at that you not bring children with you during ultrasound (echo/ vascular) testing. Due to room size and safety concerns, children are not allowed in the ultrasound rooms during exams. Our front office staff cannot provide observation of children in our lobby area while testing is being conducted. An adult accompanying a patient to their appointment will only be allowed in the ultrasound room at the discretion of the ultrasound technician under  special circumstances. We apologize for any inconvenience.   Follow-Up: At [facility], you and your health needs are our priority.  As part of our continuing mission to provide you with exceptional heart care, we have created designated Provider Care Teams.  These Care Teams include your primary Cardiologist (physician) and Advanced Practice Providers (APPs -  Physician Assistants and Nurse Practitioners) who all work together to provide you with the care you need, when you need it.  We recommend signing up for the patient portal called "MyChart".  Sign up information is provided on this After Visit Summary.  MyChart is used to connect with patients for Virtual Visits (Telemedicine).  Patients are able to view lab/test results, encounter notes, upcoming appointments, etc.  Non-urgent messages can be sent to your provider as well.   To learn more about what you can do with MyChart, go to [email or web address].    Your next appointment:   1 year(s)  The format for your next appointment:   In Person  Provider:   Olinda Bertrand, [facility]  Other Instructions Please check blood pressure readings at home and follow-up with your primary care provider.   ZIO XT- Long Term Monitor Instructions     Your physician has requested you wear a ZIO patch monitor for 14 days.  This is a single patch monitor. [employer] supplies one patch monitor per enrollment. Additional  stickers are not available. Please do not apply patch if you will be having a Nuclear Stress Test,  Echocardiogram, Cardiac CT, MRI, or Chest Xray during the period you would be wearing the  monitor. The  patch cannot be worn during these tests. You cannot remove and re-apply the  ZIO XT patch monitor.  Your ZIO patch monitor will be mailed 3 day USPS to your address on file. It may take 3-5 days  to receive your monitor after you have been enrolled.  Once you have received your monitor, please review the enclosed instructions. Your monitor   has already been registered assigning a specific monitor serial # to you.     Billing and Patient Assistance Program Information     We have supplied [employer] with any of your insurance information on file for billing purposes.  [employer] offers a sliding scale Patient Assistance Program for patients that do not have  insurance, or whose insurance does not completely cover the cost of the ZIO monitor.  You must apply for the Patient Assistance Program to qualify for this discounted rate.  To apply, please call [employer] at [phone], select option 4, select option 2, ask to apply for  Patient Assistance Program. Sanna Crystal will ask your household income, and how many people  are in your household. They will quote your out-of-pocket cost based on that information.  [employer] will also be able to set up a 47-month, interest-free payment plan if needed.     Applying the monitor     Shave hair from upper left chest.  Hold abrader disc by orange tab. Rub abrader in 40 strokes over the upper left chest as  indicated in your monitor instructions.  Clean area with 4 enclosed alcohol  pads. Let dry.  Apply patch as indicated in monitor instructions. Patch will be placed under collarbone on left  side of chest with arrow pointing upward.  Rub patch adhesive wings for 2 minutes. Remove white label marked "1". Remove the white  label marked "2". Rub patch adhesive wings for 2 additional minutes.  While looking in a mirror, press and release button in center of patch. A small green light will  flash 3-4 times. This will be your only indicator that the monitor has been turned on.  Do not shower for the first 24 hours. You may shower after the first 24 hours.  Press the button if you feel a symptom. You will hear a small click. Record Date, Time and  Symptom in the Patient Logbook.  When you are ready to remove the patch, follow instructions on the last 2 pages of Patient  Logbook. Stick patch monitor onto  the last page of Patient Logbook.  Place Patient Logbook in the blue and white box. Use locking tab on box and tape box closed  securely. The blue and white box has prepaid postage on it. Please place it in the mailbox as  soon as possible. Your physician should have your test results approximately 7 days after the  monitor has been mailed back to [facility].  Call [facility] Customer Care at [phone] if you have questions regarding  your ZIO XT patch monitor. Call them immediately if you see an orange light blinking on your  monitor.  If your monitor falls off in less than 4 days, contact our Monitor department at [phone].  If your monitor becomes loose or falls off after 4 days call [employer] at [phone] for  suggestions on securing your monitor.

## 2023-06-14 NOTE — Progress Notes (Unsigned)
 Enrolled patient for a 14 day Zio XT  monitor to be mailed to patients home

## 2023-06-14 NOTE — Progress Notes (Signed)
 Cardiology Office Note:  .   Date:  06/14/2023  ID:  Brandon Gomez, DOB May 21, 1933, MRN [id number] PCP:  Bertha Broad, MD  Former Cardiology Providers: Dr. Avery Bodo [facility] Providers Cardiologist:  Olinda Bertrand, DO , [facility] (established care 06/14/23) Electrophysiologist:  None  Click to update primary MD,subspecialty MD or APP then REFRESH:1}    Chief Complaint  Patient presents with   premature ventricular contraction   Follow-up    History of Present Illness: .   Brandon Gomez is a 89 y.o. Caucasian male whose past medical history and cardiovascular risk factors includes: Hypertension, PACs, prostate cancer, aortic atherosclerosis.  Formally under the care of Dr. Avery Bodo who last saw Brandon Gomez back in 09/2019. I am seeing him for the first time to re-establishing care.   Patient is accompanied by his wife at today's office visit also provides collateral history.  Was seen by Leala Prince, PA-C back in August 2024 and overall was doing well.  He recently had an ED visit in April 2025 for palpitations/elevated heart rate.  Patient has been in his usual state of health but earlier last month in April when he checks his vital signs at times he was noted a pulse of 140-150 bpm.  He usually ends up resting for a duration of time and his pulse usually improves.  However, on the day of ER arrival he had checked his vitals later in the day and his heart rate was high as 170 bpm and according to his wife patient appeared to be pale and therefore they decided to go to ED for further evaluation and management.  ER records reviewed.  High sensitive troponins were negative x 2, chest x-ray was unremarkable, and EKG illustrated sinus rhythm without dysrhythmia.  He was referred to cardiology for further evaluation and management.  Since his ER visit patient states that he has not had any reoccurrence of elevated heart rate.  He denies anginal chest  pain or heart failure symptoms.  No near-syncope or syncopal events.  Review of Systems: .   Review of Systems  Cardiovascular:  Negative for chest pain, claudication, irregular heartbeat, leg swelling, near-syncope, orthopnea, palpitations, paroxysmal nocturnal dyspnea and syncope.  Respiratory:  Negative for shortness of breath.   Hematologic/Lymphatic: Negative for bleeding problem.    Studies Reviewed:   EKG: EKG Interpretation Date/Time:  Monday Jun 14 2023 08:24:18 EDT Ventricular Rate:  64 PR Interval:  234 QRS Duration:  98 QT Interval:  408 QTC Calculation: 420 R Axis:   12  Text Interpretation: Sinus rhythm with 1st degree A-V block with Premature supraventricular complexes Non-specific ST-t changes When compared with ECG of 26-May-2023 14:43, ST & T wave abnormality Inferior leads are new Confirmed by Olinda Bertrand [phone]) on 06/14/2023 8:34:52 AM  RADIOLOGY: CXR 05/2023: No acute process.  Risk Assessment/Calculations:   NA   Labs:       Latest Ref Rng & Units 05/26/2023    2:54 PM 07/06/2008    3:46 PM  CBC  WBC 4.0 - 10.5 K/uL 6.8  5.3   Hemoglobin 13.0 - 17.0 g/dL 41.3  24.4   Hematocrit 39.0 - 52.0 % 38.6  43.3   Platelets 150 - 400 K/uL 177  169        Latest Ref Rng & Units 05/26/2023    2:54 PM 07/06/2008    3:46 PM  BMP  Glucose 70 - 99 mg/dL 010  272   BUN 8 - 23  mg/dL 16  15   Creatinine 1.61 - 1.24 mg/dL 0.96  1.2   Sodium 045 - 145 mmol/L 139  142   Potassium 3.5 - 5.1 mmol/L 4.0  3.9   Chloride 98 - 111 mmol/L 104  105   CO2 22 - 32 mmol/L 24  25   Calcium 8.9 - 10.3 mg/dL 9.3  9.4       Latest Ref Rng & Units 05/26/2023    2:54 PM 07/06/2008    3:46 PM  CMP  Glucose 70 - 99 mg/dL 409  811   BUN 8 - 23 mg/dL 16  15   Creatinine 9.14 - 1.24 mg/dL 7.82  1.2   Sodium 956 - 145 mmol/L 139  142   Potassium 3.5 - 5.1 mmol/L 4.0  3.9   Chloride 98 - 111 mmol/L 104  105   CO2 22 - 32 mmol/L 24  25   Calcium 8.9 - 10.3 mg/dL 9.3  9.4   Total  Protein 6.5 - 8.1 g/dL 7.4    Total Bilirubin 0.0 - 1.2 mg/dL 0.2    Alkaline Phos 38 - 126 U/L 69    AST 15 - 41 U/L 25    ALT 0 - 44 U/L 19      Lab Results  Component Value Date   LDLDIRECT 138 (H) 10/06/2022   No results for input(s): "LIPOA" in the last 8760 hours. No components found for: "NTPROBNP" No results for input(s): "PROBNP" in the last 8760 hours. No results for input(s): "TSH" in the last 8760 hours.  Physical Exam:    Today's Vitals   06/14/23 0821  BP: 130/74  Pulse: 64  Resp: 16  SpO2: 94%  Weight: 188 lb 3.2 oz (85.4 kg)  Height: 5\' 7"  (1.702 m)   Body mass index is 29.48 kg/m. Wt Readings from Last 3 Encounters:  06/14/23 188 lb 3.2 oz (85.4 kg)  05/26/23 183 lb (83 kg)  10/06/22 190 lb 9.6 oz (86.5 kg)    Physical Exam  Constitutional: No distress.  hemodynamically stable  HENT:  Hearing aid  Neck: No JVD present.  Cardiovascular: Normal rate, regular rhythm, S1 normal and S2 normal. Exam reveals no gallop, no S3 and no S4.  No murmur heard. Pulmonary/Chest: Effort normal and breath sounds normal. No stridor. He has no wheezes. He has no rales.  Musculoskeletal:        General: No edema.     Cervical back: Neck supple.  Skin: Skin is warm.     Impression & Recommendation(s):  Impression:   ICD-10-CM   1. PVC (premature ventricular contraction)  I49.3 EKG 12-Lead    2. Premature atrial contraction  I49.1     3. Mixed hyperlipidemia  E78.2     4. Elevated blood pressure reading without diagnosis of hypertension  R03.0     5. Coronary atherosclerosis due to calcified coronary lesion  I25.10    I25.84     6. Palpitations  R00.2 LONG TERM MONITOR (3-14 DAYS)    TSH    TSH    7. Nonspecific abnormal electrocardiogram (ECG) (EKG)  R94.31 ECHOCARDIOGRAM COMPLETE       Recommendation(s):  PVC (premature ventricular contraction) Premature atrial contraction Known history of symptomatic PACs and PVCs. EKG illustrates SR w/ rare  PACs Recent ER records reviewed. 2-week Zio patch to evaluate for dysrhythmias Check TSH May consider smart watch technology or Kardia mobile as alternatives for long-term monitoring of underlying rhythm.  Patient  and wife to consider.  Elevated blood pressures without diagnosis of hypertension Office blood pressures are acceptable. Noted to have elevated blood pressures in the past. Advised them to keep a log of blood pressures at home and to review with PCP to see if medication titration is warranted.  Mixed hyperlipidemia Currently on rosuvastatin 10 mg p.o. daily  Coronary atherosclerosis due to calcified coronary lesion 2019 CT chest noted erythematous calcification including the coronary arteries and aorta. Patient is on statin and aspirin 81 mg p.o. daily Tries to follow heart healthy diet   Orders Placed:  Orders Placed This Encounter  Procedures   TSH    Standing Status:   Future    Number of Occurrences:   1    Expected Date:   06/14/2023    Expiration Date:   06/13/2024   LONG TERM MONITOR (3-14 DAYS)    Standing Status:   Future    Number of Occurrences:   1    Expected Date:   06/15/2023    Expiration Date:   06/13/2024    Where should this test be performed?:   [facility]    Does the patient have an implanted cardiac device?:   No    Prescribed days of wear:   14    Type of enrollment:   Home Enrollment    Vendor::   Zio   EKG 12-Lead   ECHOCARDIOGRAM COMPLETE    Standing Status:   Future    Expected Date:   06/21/2023    Expiration Date:   06/13/2024    [facility]    Does the patient weigh less than or greater than 250 lbs?:   Patient weighs less than 250 lbs    Perflutren DEFINITY (image enhancing agent) should be administered unless hypersensitivity or allergy exist:   Administer Perflutren    Reason for exam-Echo:   Other-Full Diagnosis List    Full ICD-10/Reason for Exam:   Nonspecific abnormal electrocardiogram  (ECG) (EKG) [794.31.ICD-9-CM]     Final Medication List:   No orders of the defined types were placed in this encounter.   Medications Discontinued During This Encounter  Medication Reason   leuprolide , 6 Month, (ELIGARD ) 45 MG injection Patient Preference   lovastatin (MEVACOR) 40 MG tablet Patient Preference   Omega-3 Fatty Acids (OMEGA 3 PO) Patient Preference     Current Outpatient Medications:    abiraterone acetate (ZYTIGA) 250 MG tablet, Take 1,000 mg by mouth daily., Disp: , Rfl:    aspirin 81 MG tablet, Take 81 mg by mouth as needed., Disp: , Rfl:    predniSONE (DELTASONE) 5 MG tablet, Take 5 mg by mouth daily., Disp: , Rfl:    rosuvastatin (CRESTOR) 10 MG tablet, Take 10 mg by mouth at bedtime., Disp: , Rfl:    tamsulosin (FLOMAX) 0.4 MG CAPS capsule, Take 0.4 mg by mouth daily., Disp: , Rfl:   Consent:   NA  Disposition:   1 year follow up.   His questions and concerns were addressed to his satisfaction. He voices understanding of the recommendations provided during this encounter.    Signed, Awilda Bogus, [facility]  [facility]  06/14/2023 12:45 PM

## 2023-06-15 LAB — TSH: TSH: 4.08 u[IU]/mL (ref 0.450–4.500)

## 2023-06-17 ENCOUNTER — Encounter: Payer: Self-pay | Admitting: Cardiology

## 2023-07-08 ENCOUNTER — Telehealth: Payer: Self-pay | Admitting: Cardiology

## 2023-07-08 DIAGNOSIS — R002 Palpitations: Secondary | ICD-10-CM | POA: Diagnosis not present

## 2023-07-08 NOTE — Telephone Encounter (Signed)
 Brandon Gomez [phone]    Zio available for DR Albert Huff review... they wanted to alert him of the 60 sec SVT on Pg 13 Strip 7.. on 07/01/23.   Will forward.

## 2023-07-08 NOTE — Telephone Encounter (Signed)
 Caller Ivin Marrow) is reporting abnormal Zio results.

## 2023-07-09 ENCOUNTER — Ambulatory Visit: Payer: Self-pay | Admitting: Cardiology

## 2023-07-09 DIAGNOSIS — R002 Palpitations: Secondary | ICD-10-CM

## 2023-07-12 MED ORDER — METOPROLOL SUCCINATE ER 50 MG PO TB24: 50.0000 mg | ORAL_TABLET | Freq: Every day | ORAL | 3 refills | Status: DC

## 2023-07-12 NOTE — Telephone Encounter (Signed)
 Result note addressed. Refer to result note for information regarding phone call.

## 2023-07-12 NOTE — Telephone Encounter (Signed)
 Please address his result note.   Terril Chestnut Petronila, DO, FACC

## 2023-07-16 ENCOUNTER — Ambulatory Visit (HOSPITAL_COMMUNITY)
Admission: RE | Admit: 2023-07-16 | Discharge: 2023-07-16 | Disposition: A | Discharge status: Home / Self Care | Source: Ambulatory Visit | Attending: Internal Medicine | Admitting: Internal Medicine

## 2023-07-16 DIAGNOSIS — R9431 Abnormal electrocardiogram [ECG] [EKG]: Secondary | ICD-10-CM | POA: Diagnosis not present

## 2023-07-16 LAB — ECHOCARDIOGRAM COMPLETE
Area-P 1/2: 4.39 cm2
S' Lateral: 3.1 cm

## 2023-07-22 DIAGNOSIS — C61 Malignant neoplasm of prostate: Secondary | ICD-10-CM | POA: Diagnosis not present

## 2023-07-22 DIAGNOSIS — C775 Secondary and unspecified malignant neoplasm of intrapelvic lymph nodes: Secondary | ICD-10-CM | POA: Diagnosis not present

## 2023-07-22 DIAGNOSIS — N5201 Erectile dysfunction due to arterial insufficiency: Secondary | ICD-10-CM | POA: Diagnosis not present

## 2023-07-22 DIAGNOSIS — R3912 Poor urinary stream: Secondary | ICD-10-CM | POA: Diagnosis not present

## 2023-10-28 DIAGNOSIS — R3912 Poor urinary stream: Secondary | ICD-10-CM | POA: Diagnosis not present

## 2023-10-28 DIAGNOSIS — C775 Secondary and unspecified malignant neoplasm of intrapelvic lymph nodes: Secondary | ICD-10-CM | POA: Diagnosis not present

## 2023-10-28 DIAGNOSIS — N5201 Erectile dysfunction due to arterial insufficiency: Secondary | ICD-10-CM | POA: Diagnosis not present

## 2023-10-28 DIAGNOSIS — C61 Malignant neoplasm of prostate: Secondary | ICD-10-CM | POA: Diagnosis not present

## 2023-11-10 ENCOUNTER — Other Ambulatory Visit: Payer: Self-pay

## 2023-11-10 MED ORDER — METOPROLOL SUCCINATE ER 50 MG PO TB24: 50.0000 mg | ORAL_TABLET | Freq: Every day | ORAL | 2 refills | Status: AC

## 2023-12-09 DIAGNOSIS — H903 Sensorineural hearing loss, bilateral: Secondary | ICD-10-CM | POA: Diagnosis not present

## 2023-12-16 DIAGNOSIS — L814 Other melanin hyperpigmentation: Secondary | ICD-10-CM | POA: Diagnosis not present

## 2023-12-16 DIAGNOSIS — L821 Other seborrheic keratosis: Secondary | ICD-10-CM | POA: Diagnosis not present

## 2023-12-16 DIAGNOSIS — L578 Other skin changes due to chronic exposure to nonionizing radiation: Secondary | ICD-10-CM | POA: Diagnosis not present

## 2024-01-20 ENCOUNTER — Other Ambulatory Visit (HOSPITAL_COMMUNITY): Payer: Self-pay

## 2024-02-21 ENCOUNTER — Other Ambulatory Visit (HOSPITAL_COMMUNITY): Payer: Self-pay | Admitting: Urology

## 2024-02-21 DIAGNOSIS — C61 Malignant neoplasm of prostate: Secondary | ICD-10-CM

## 2024-03-02 ENCOUNTER — Encounter (HOSPITAL_COMMUNITY)
Admission: RE | Admit: 2024-03-02 | Discharge: 2024-03-02 | Disposition: A | Discharge status: Home / Self Care | Source: Ambulatory Visit | Attending: Urology | Admitting: Urology

## 2024-03-02 DIAGNOSIS — C61 Malignant neoplasm of prostate: Secondary | ICD-10-CM | POA: Insufficient documentation

## 2024-03-02 MED ORDER — FLOTUFOLASTAT F 18 GALLIUM 296-5846 MBQ/ML IV SOLN
7.9000 | Freq: Once | INTRAVENOUS | Status: AC
  Administered 2024-03-02: 7.9 via INTRAVENOUS

## 2024-03-13 ENCOUNTER — Telehealth: Payer: Self-pay | Admitting: Cardiology

## 2024-03-13 NOTE — Telephone Encounter (Signed)
Ok  ST
